# Patient Record
Sex: Male | Born: 1965 | Race: Black or African American | Hispanic: No | Marital: Single | Smoking: Never smoker
Health system: Southern US, Community
[De-identification: ages and names within clinical notes are randomized; demographics above are authoritative.]

## PROBLEM LIST (undated history)

## (undated) DIAGNOSIS — G459 Transient cerebral ischemic attack, unspecified: Secondary | ICD-10-CM

## (undated) DIAGNOSIS — E119 Type 2 diabetes mellitus without complications: Secondary | ICD-10-CM

## (undated) DIAGNOSIS — R42 Dizziness and giddiness: Secondary | ICD-10-CM

## (undated) DIAGNOSIS — T7840XA Allergy, unspecified, initial encounter: Secondary | ICD-10-CM

## (undated) DIAGNOSIS — J45909 Unspecified asthma, uncomplicated: Secondary | ICD-10-CM

## (undated) DIAGNOSIS — I10 Essential (primary) hypertension: Secondary | ICD-10-CM

## (undated) HISTORY — DX: Unspecified asthma, uncomplicated: J45.909

## (undated) HISTORY — DX: Dizziness and giddiness: R42

## (undated) HISTORY — PX: HAND TENDON SURGERY: SHX663

## (undated) HISTORY — DX: Allergy, unspecified, initial encounter: T78.40XA

## (undated) HISTORY — PX: KNEE SURGERY: SHX244

## (undated) HISTORY — PX: APPENDECTOMY: SHX54

## (undated) HISTORY — PX: HERNIA REPAIR: SHX51

---

## 1998-10-19 ENCOUNTER — Emergency Department (HOSPITAL_COMMUNITY): Admission: EM | Admit: 1998-10-19 | Discharge: 1998-10-19 | Payer: Self-pay | Admitting: Emergency Medicine

## 1999-08-09 ENCOUNTER — Emergency Department (HOSPITAL_COMMUNITY): Admission: EM | Admit: 1999-08-09 | Discharge: 1999-08-09 | Payer: Self-pay | Admitting: Emergency Medicine

## 1999-10-25 ENCOUNTER — Encounter: Payer: Self-pay | Admitting: Emergency Medicine

## 1999-10-25 ENCOUNTER — Emergency Department (HOSPITAL_COMMUNITY): Admission: EM | Admit: 1999-10-25 | Discharge: 1999-10-25 | Payer: Self-pay | Admitting: Emergency Medicine

## 2001-04-06 ENCOUNTER — Ambulatory Visit: Admission: RE | Admit: 2001-04-06 | Discharge: 2001-04-06 | Payer: Self-pay | Admitting: Pulmonary Disease

## 2001-05-27 ENCOUNTER — Ambulatory Visit: Admission: RE | Admit: 2001-05-27 | Discharge: 2001-05-27 | Payer: Self-pay | Admitting: Pulmonary Disease

## 2001-12-14 ENCOUNTER — Emergency Department (HOSPITAL_COMMUNITY): Admission: EM | Admit: 2001-12-14 | Discharge: 2001-12-14 | Payer: Self-pay | Admitting: *Deleted

## 2002-08-23 ENCOUNTER — Ambulatory Visit (HOSPITAL_BASED_OUTPATIENT_CLINIC_OR_DEPARTMENT_OTHER): Admission: RE | Admit: 2002-08-23 | Discharge: 2002-08-24 | Payer: Self-pay | Admitting: Orthopedic Surgery

## 2003-01-26 ENCOUNTER — Emergency Department (HOSPITAL_COMMUNITY): Admission: EM | Admit: 2003-01-26 | Discharge: 2003-01-26 | Payer: Self-pay | Admitting: Emergency Medicine

## 2003-04-18 ENCOUNTER — Encounter: Payer: Self-pay | Admitting: Emergency Medicine

## 2003-04-18 ENCOUNTER — Emergency Department (HOSPITAL_COMMUNITY): Admission: EM | Admit: 2003-04-18 | Discharge: 2003-04-18 | Payer: Self-pay | Admitting: Emergency Medicine

## 2004-03-27 ENCOUNTER — Emergency Department (HOSPITAL_COMMUNITY): Admission: EM | Admit: 2004-03-27 | Discharge: 2004-03-27 | Payer: Self-pay | Admitting: Emergency Medicine

## 2005-02-16 ENCOUNTER — Emergency Department (HOSPITAL_COMMUNITY): Admission: EM | Admit: 2005-02-16 | Discharge: 2005-02-16 | Payer: Self-pay | Admitting: Family Medicine

## 2005-10-01 ENCOUNTER — Ambulatory Visit: Payer: Self-pay | Admitting: Pulmonary Disease

## 2007-05-21 ENCOUNTER — Emergency Department (HOSPITAL_COMMUNITY): Admission: EM | Admit: 2007-05-21 | Discharge: 2007-05-21 | Payer: Self-pay | Admitting: Emergency Medicine

## 2007-09-01 ENCOUNTER — Emergency Department (HOSPITAL_COMMUNITY): Admission: EM | Admit: 2007-09-01 | Discharge: 2007-09-01 | Payer: Self-pay | Admitting: Family Medicine

## 2007-11-21 ENCOUNTER — Emergency Department (HOSPITAL_COMMUNITY): Admission: EM | Admit: 2007-11-21 | Discharge: 2007-11-21 | Payer: Self-pay | Admitting: Emergency Medicine

## 2008-01-13 ENCOUNTER — Emergency Department (HOSPITAL_COMMUNITY): Admission: EM | Admit: 2008-01-13 | Discharge: 2008-01-13 | Payer: Self-pay | Admitting: Emergency Medicine

## 2008-06-15 ENCOUNTER — Emergency Department (HOSPITAL_COMMUNITY): Admission: EM | Admit: 2008-06-15 | Discharge: 2008-06-15 | Payer: Self-pay | Admitting: Family Medicine

## 2008-11-18 ENCOUNTER — Emergency Department (HOSPITAL_COMMUNITY): Admission: EM | Admit: 2008-11-18 | Discharge: 2008-11-18 | Payer: Self-pay | Admitting: Emergency Medicine

## 2010-02-09 ENCOUNTER — Emergency Department (HOSPITAL_COMMUNITY): Admission: EM | Admit: 2010-02-09 | Discharge: 2010-02-09 | Payer: Self-pay | Admitting: Family Medicine

## 2010-02-12 ENCOUNTER — Emergency Department (HOSPITAL_COMMUNITY): Admission: EM | Admit: 2010-02-12 | Discharge: 2010-02-12 | Payer: Self-pay | Admitting: Emergency Medicine

## 2010-02-13 ENCOUNTER — Observation Stay (HOSPITAL_COMMUNITY): Admission: EM | Admit: 2010-02-13 | Discharge: 2010-02-14 | Payer: Self-pay | Admitting: Emergency Medicine

## 2010-02-17 ENCOUNTER — Emergency Department (HOSPITAL_COMMUNITY): Admission: EM | Admit: 2010-02-17 | Discharge: 2010-02-17 | Payer: Self-pay | Admitting: Emergency Medicine

## 2010-02-28 ENCOUNTER — Emergency Department (HOSPITAL_COMMUNITY): Admission: EM | Admit: 2010-02-28 | Discharge: 2010-02-28 | Payer: Self-pay | Admitting: Family Medicine

## 2010-09-26 LAB — DIFFERENTIAL
Basophils Absolute: 0 10*3/uL (ref 0.0–0.1)
Basophils Relative: 0 % (ref 0–1)
Lymphocytes Relative: 20 % (ref 12–46)
Monocytes Absolute: 1 10*3/uL (ref 0.1–1.0)
Monocytes Relative: 15 % — ABNORMAL HIGH (ref 3–12)
Neutro Abs: 4.1 10*3/uL (ref 1.7–7.7)
Neutrophils Relative %: 64 % (ref 43–77)

## 2010-09-26 LAB — POCT I-STAT, CHEM 8
BUN: 14 mg/dL (ref 6–23)
Calcium, Ion: 1.17 mmol/L (ref 1.12–1.32)
Chloride: 106 mEq/L (ref 96–112)
Creatinine, Ser: 1 mg/dL (ref 0.4–1.5)
Glucose, Bld: 89 mg/dL (ref 70–99)
HCT: 43 % (ref 39.0–52.0)
Hemoglobin: 14.6 g/dL (ref 13.0–17.0)
Potassium: 3.8 mEq/L (ref 3.5–5.1)
Sodium: 140 mEq/L (ref 135–145)
TCO2: 24 mmol/L (ref 0–100)

## 2010-09-26 LAB — URINALYSIS, ROUTINE W REFLEX MICROSCOPIC
Bilirubin Urine: NEGATIVE
Hgb urine dipstick: NEGATIVE
Ketones, ur: NEGATIVE mg/dL
Specific Gravity, Urine: 1.018 (ref 1.005–1.030)
pH: 6.5 (ref 5.0–8.0)

## 2010-09-26 LAB — BASIC METABOLIC PANEL
Calcium: 9 mg/dL (ref 8.4–10.5)
GFR calc non Af Amer: >60 mL/min (ref >60)
Glucose, Bld: 89 mg/dL (ref 70–99)
Potassium: 4.1 mEq/L (ref 3.5–5.1)
Sodium: 136 mEq/L (ref 135–145)

## 2010-09-26 LAB — STOOL CULTURE

## 2010-09-26 LAB — CBC
HCT: 42.2 % (ref 39.0–52.0)
Hemoglobin: 14.2 g/dL (ref 13.0–17.0)
MCHC: 33.6 g/dL (ref 30.0–36.0)
RDW: 12.6 % (ref 11.5–15.5)
WBC: 6.4 10*3/uL (ref 4.0–10.5)

## 2010-09-26 LAB — POCT RAPID STREP A (OFFICE): Streptococcus, Group A Screen (Direct): NEGATIVE

## 2010-11-28 NOTE — Op Note (Signed)
NAME:  Reginald Collier, Reginald Collier                            ACCOUNT NO.:  [id number]   MEDICAL RECORD NO.:  [id number]                   PATIENT TYPE:  AMB   LOCATION:  DSC                                  FACILITY:  [facility]   PHYSICIAN:  Feliberto Gottron. Turner Daniels, M.D.                DATE OF BIRTH:  1966-06-17   DATE OF PROCEDURE:  DATE OF DISCHARGE:                                 OPERATIVE REPORT   PREOPERATIVE DIAGNOSES:  1. Right knee medial meniscal tear.  2. Possible acromioclavicular ligament tear.   POSTOPERATIVE DIAGNOSES:  1. Right knee medial meniscal tear, posterior horn complex.  2. Multiple cartilaginous loose bodies from a medial femoral condyle donor     site.  3. Chondromalacia patella.   PROCEDURE:  Right knee arthroscopic partial medial meniscectomy, removal of  loose bodies and debridement of chondromalacia.   SURGEON:  Feliberto Gottron. Turner Daniels, M.D.   FIRST ASSISTANT:  None.   ANESTHESIA:  General LMA.   ESTIMATED BLOOD LOSS:  Minimal.   FLUIDS REPLACED:  800 cc of crystalloid.   DRAINS PLACED:  None.   TOURNIQUET TIME:  None.   INDICATIONS FOR PROCEDURE:  This is a 45 year old gentleman who was injured  at work.  He has had chronic medial joint line pain to his right knee and  may have an ACL tear on clinical examination.  He definitely has a medial  meniscal tear with effusion, pain and catching in his knee, and because of  that is taken for arthroscopic evaluation and treatment with reconstruction  of the ACL if it is torn.   DESCRIPTION OF PROCEDURE:  The patient is identified by arm band, taken to  the operating room at [facility].  Appropriate anesthetic  monitors were attached and general LMA anesthesia induced with the patient  in the supine position.  Lateral post applied to the table and the right  lower extremity prepped and draped in the usual sterile fashion from the  ankle to the mid thigh.  The inferomedial and inferolateral peripatellar  portal  regions were infiltrated with 2-3 cc of 0.5% Marcaine and epinephrine  solution, standard portals were made in the inferomedial, inferolateral and  peripatellar position, followed by introduction of the arthroscope in the  inferolateral portal and the outflow through the inferomedial portal.  We  identified grade 3 chondromalacia of the apex of the patella which was  reamed back stable margins with a 3.5 sucker-shaver.  Moving into the medial  compartment, we encountered two cartilaginous loose bodies, they were  basically delaminated cartilage about 5 mm x 3 mm on one, and 7 mm x 3 mm on  another.  The donor site was identified on the medial femoral condyle with  flap tears and this was debrided back to stable margin as well.  There is  still maybe 1 mm of cartilage remaining in the base  of the crater from the  debridement.  Complex degenerative tear of the posterior horn of the medial  meniscus was identified and debrided back to stable margin with a 3.5 Gator  sucker-shaver, alternating with a straight biter.  The ACL and PCL were  noted to be intact in the lateral compartment, the meniscus and lateral  femoral condyle were in good condition.  There was softening of the  articular cartilage at the lateral tibial condyle with grade 2  chondromalacia which was also lightly debrided and probed.  The gutters were  cleared of any loose bodies.  The scope was taken medial to the TCL to  examine the posterior compartment where no further loose bodies were  encountered.  The knee was washed out with normal saline solution.  The  arthroscopic instruments were removed, dressed with Xeroform, 4 x 4  dressing, sponges, Webril and Ace wrap applied.  Patient was awakened and  taken to the recover room without difficulty.                                               Feliberto Gottron. Turner Daniels, M.D.    Ovid Curd  D:  08/23/2002  T:  08/23/2002  Job:  [id number]

## 2012-10-12 ENCOUNTER — Ambulatory Visit: Payer: Self-pay | Admitting: Family Medicine

## 2012-10-12 VITALS — BP 130/85 | HR 70 | Temp 99.4°F | Resp 17 | Ht 72.5 in | Wt 266.0 lb

## 2012-10-12 DIAGNOSIS — Z0289 Encounter for other administrative examinations: Secondary | ICD-10-CM

## 2012-10-12 DIAGNOSIS — J309 Allergic rhinitis, unspecified: Secondary | ICD-10-CM | POA: Insufficient documentation

## 2012-10-12 DIAGNOSIS — Z0000 Encounter for general adult medical examination without abnormal findings: Secondary | ICD-10-CM
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
## 2012-10-12 NOTE — Patient Instructions (Signed)
You had a small amount of blood and protein in your urine today.  Please follow-up this issues with the doctor of your choice.   Otherwise you meet qualifications for a 2 year DOT card today

## 2013-02-14 ENCOUNTER — Encounter: Payer: Self-pay | Admitting: Family Medicine

## 2014-05-27 ENCOUNTER — Ambulatory Visit (INDEPENDENT_AMBULATORY_CARE_PROVIDER_SITE_OTHER): Payer: 59 | Admitting: Internal Medicine

## 2014-05-27 VITALS — BP 155/106 | HR 86 | Temp 97.9°F | Resp 20 | Ht 71.5 in | Wt 280.1 lb

## 2014-05-27 DIAGNOSIS — Z6838 Body mass index (BMI) 38.0-38.9, adult: Secondary | ICD-10-CM | POA: Insufficient documentation

## 2014-05-27 DIAGNOSIS — G4733 Obstructive sleep apnea (adult) (pediatric): Secondary | ICD-10-CM

## 2014-05-27 DIAGNOSIS — M170 Bilateral primary osteoarthritis of knee: Secondary | ICD-10-CM

## 2014-05-27 DIAGNOSIS — M79671 Pain in right foot: Secondary | ICD-10-CM

## 2014-05-27 DIAGNOSIS — G4730 Sleep apnea, unspecified: Secondary | ICD-10-CM

## 2014-05-27 DIAGNOSIS — M172 Bilateral post-traumatic osteoarthritis of knee: Secondary | ICD-10-CM

## 2014-05-27 DIAGNOSIS — R112 Nausea with vomiting, unspecified: Secondary | ICD-10-CM

## 2014-05-27 DIAGNOSIS — R030 Elevated blood-pressure reading, without diagnosis of hypertension: Secondary | ICD-10-CM

## 2014-05-27 DIAGNOSIS — R358 Other polyuria: Secondary | ICD-10-CM

## 2014-05-27 DIAGNOSIS — J45901 Unspecified asthma with (acute) exacerbation: Secondary | ICD-10-CM

## 2014-05-27 DIAGNOSIS — R5383 Other fatigue: Secondary | ICD-10-CM

## 2014-05-27 DIAGNOSIS — R3589 Other polyuria: Secondary | ICD-10-CM

## 2014-05-27 DIAGNOSIS — J069 Acute upper respiratory infection, unspecified: Secondary | ICD-10-CM

## 2014-05-27 DIAGNOSIS — H538 Other visual disturbances: Secondary | ICD-10-CM

## 2014-05-27 LAB — POCT UA - MICROSCOPIC ONLY
BACTERIA, U MICROSCOPIC: NEGATIVE
CASTS, UR, LPF, POC: NEGATIVE
Crystals, Ur, HPF, POC: NEGATIVE
Epithelial cells, urine per micros: NEGATIVE
Mucus, UA: NEGATIVE
RBC, URINE, MICROSCOPIC: NEGATIVE
WBC, Ur, HPF, POC: NEGATIVE
Yeast, UA: NEGATIVE

## 2014-05-27 LAB — GLUCOSE, POCT (MANUAL RESULT ENTRY): POC GLUCOSE: 81 mg/dL (ref 70–99)

## 2014-05-27 LAB — POCT CBC
GRANULOCYTE PERCENT: 58.4 % (ref 37–80)
HEMATOCRIT: 45.7 % (ref 43.5–53.7)
Hemoglobin: 14.3 g/dL (ref 14.1–18.1)
Lymph, poc: 2.4 (ref 0.6–3.4)
MCH, POC: 29.2 pg (ref 27–31.2)
MCHC: 31.4 g/dL — AB (ref 31.8–35.4)
MCV: 93.1 fL (ref 80–97)
MID (cbc): 0.2 (ref 0–0.9)
MPV: 6.7 fL (ref 0–99.8)
POC Granulocyte: 3.7 (ref 2–6.9)
POC LYMPH PERCENT: 37.9 %L (ref 10–50)
POC MID %: 3.7 %M (ref 0–12)
Platelet Count, POC: 311 10*3/uL (ref 142–424)
RBC: 4.9 M/uL (ref 4.69–6.13)
RDW, POC: 14.5 %
WBC: 6.4 10*3/uL (ref 4.6–10.2)

## 2014-05-27 LAB — POCT GLYCOSYLATED HEMOGLOBIN (HGB A1C): HEMOGLOBIN A1C: 5.6

## 2014-05-27 LAB — POCT URINALYSIS DIPSTICK
BILIRUBIN UA: NEGATIVE
GLUCOSE UA: NEGATIVE
Ketones, UA: NEGATIVE
LEUKOCYTES UA: NEGATIVE
NITRITE UA: NEGATIVE
PH UA: 6
Protein, UA: NEGATIVE
Spec Grav, UA: 1.02
UROBILINOGEN UA: 0.2

## 2014-05-27 MED ORDER — ONDANSETRON HCL 4 MG PO TABS: 4.0000 mg | ORAL_TABLET | Freq: Three times a day (TID) | ORAL | Status: DC | PRN

## 2014-05-27 NOTE — Progress Notes (Signed)
Subjective:    Patient ID: Reginald Collier, male    DOB: 1966-01-26, 48 y.o.   MRN: [id number] This chart was scribed for Tami Lin, MD by Marti Sleigh, Medical Scribe. This patient was seen in Room 2 and the patient's care was started a 3:59 PM.   HPI HPI Comments: Reginald Collier is a 48 y.o. male with a past hx of asthma and sleep apnea who presents to [facility] complaining of fatigue,  and urgent urination that has been ongoing for the last 2 months. Pt endorses increased nocturnal urination and polydipsia. Pt denies dysuria, hesitation, change in stream, dribbling. No penile discharge. No history of prostate problems. For the past several days he has had episodes of blurred vision although this has not interfered with driving. No headaches or dizziness. No syncope. No chest pain. No palpitations. No unsteady gait. No peripheral sensory losses.  Pt states that he has a family hx of DM (grandmother, mother, and cousin).   Pt also endorses SOB that has been ongoing for the last several months.evaluation at the [facility] has included a stress test which is normal. He has a history of exercise-induced bronchospasm and he still gets symptomatic from time to time. [facility] which [facility] been [facility].  Pt also complains of nausea with associated vomiting, fever, chills, and body aches that started 3d ago. Also nasal congestion with slight sore throat that seems to be improving.   Pt also states that his right foot has been hurting for the last six months intermittently with standing or walking. Pt states he has bad knees, and is scheduled for bilateral knee replacement at the [place]. This is been delayed due to their scheduling problems.  Patient Active Problem List   Diagnosis Date Noted  . BMI 38.0-38.9,adult 05/27/2014  . OSA (obstructive sleep apnea) 05/27/2014  . Osteoarthritis of both knees 05/27/2014  . Allergic rhinitis 10/12/2012  there is no history of hypertension  although he is being evaluated for this and monitored on 2 prior occasions where it was finally determined that he did not have hypertension that required medical therapy.  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  HENT: Positive for congestion and rhinorrhea.   Eyes: Positive for visual disturbance.  Respiratory: Positive for cough and shortness of breath.   Gastrointestinal: Positive for nausea and vomiting.  Genitourinary: Positive for urgency and frequency. Negative for dysuria and difficulty urinating.  Musculoskeletal: Positive for gait problem ( secondary to right foot pain and bilateral knee pain).       Objective:   Physical Exam  Constitutional: He is oriented to person, place, and time. He appears well-developed and well-nourished. No distress.  HENT:  Head: Normocephalic and atraumatic.  Right Ear: External ear normal.  Left Ear: External ear normal.  Nose: Nose normal.  Mouth/Throat: Oropharynx is clear and moist.  Very shallow hypopharynx.   Eyes: Conjunctivae and EOM are normal. Pupils are equal, round, and reactive to light.  Neck: Neck supple. No thyromegaly present.  Cardiovascular: Normal rate, regular rhythm, normal heart sounds and intact distal pulses.   No murmur heard. Pulmonary/Chest: Effort normal and breath sounds normal. No respiratory distress. He has no wheezes.  Musculoskeletal: He exhibits no edema.  TTP on the plantar aspect of right foot near mid sole. Adequate dorsiflexion of the  foot. Fallen arches bilaterally.   Lymphadenopathy:    He has no cervical adenopathy.  Neurological: He is alert and oriented to person, place, and time. He has normal reflexes. No cranial nerve deficit. He exhibits normal muscle tone. Coordination normal.  Skin: Skin is warm and dry.  Psychiatric: He has a normal mood  and affect. His behavior is normal. Thought content normal.  Nursing note and vitals reviewed.  Results for orders placed or performed in visit on 05/27/14  POCT CBC  Result Value Ref Range   WBC 6.4 4.6 - 10.2 K/uL   Lymph, poc 2.4 0.6 - 3.4   POC LYMPH PERCENT 37.9 10 - 50 %L   MID (cbc) 0.2 0 - 0.9   POC MID % 3.7 0 - 12 %M   POC Granulocyte 3.7 2 - 6.9   Granulocyte percent 58.4 37 - 80 %G   RBC 4.90 4.69 - 6.13 M/uL   Hemoglobin 14.3 14.1 - 18.1 g/dL   HCT, POC 45.7 43.5 - 53.7 %   MCV 93.1 80 - 97 fL   MCH, POC 29.2 27 - 31.2 pg   MCHC 31.4 (A) 31.8 - 35.4 g/dL   RDW, POC 14.5 %   Platelet Count, POC 311 142 - 424 K/uL   MPV 6.7 0 - 99.8 fL  POCT glucose (manual entry)  Result Value Ref Range   POC Glucose 81 70 - 99 mg/dl  POCT glycosylated hemoglobin (Hb A1C)  Result Value Ref Range   Hemoglobin A1C 5.6   POCT urinalysis dipstick  Result Value Ref Range   Color, UA yellow    Clarity, UA clear    Glucose, UA neg    Bilirubin, UA neg    Ketones, UA neg    Spec Grav, UA 1.020    Blood, UA trace-lysed    pH, UA 6.0    Protein, UA neg    Urobilinogen, UA 0.2    Nitrite, UA neg    Leukocytes, UA Negative   POCT UA - Microscopic Only  Result Value Ref Range   WBC, Ur, HPF, POC neg    RBC, urine, microscopic neg    Bacteria, U Microscopic neg    Mucus, UA neg    Epithelial cells, urine per micros neg    Crystals, Ur, HPF, POC neg    Casts, Ur, LPF, POC neg    Yeast, UA neg        Assessment & Plan:   Blurred vision -unclear etiology/recommend ophthalmology evaluation  Will take out of work to restrict driving activity for the next 4 days until we had a chance to evaluate this Polyuria - Other fatigue - Sleep apnea-on CPAP  Osteoarthritis of both knees, unspecified osteoarthritis type  Reactive airway disease, unspecified asthma severity, with acute exacerbation -   URI (upper respiratory infection) - resolving  Right foot pain--early plantar  fasciitis secondary to change in gait from the pain//follow with orthopedics at the [facility]  Nausea and vomiting, vomiting of unspecified type - ?related to acute infection//light diet plus Zofran and fluids and follow for  changes in symptoms  Elevated blood pressure reading without diagnosis of hypertension---home blood pressure twice a day and report results in 4 days  BMI 38.0-38.9,adult  Post-[facility] of both [facility]  Meds ordered this encounter  Medications  . ondansetron (ZOFRAN) 4 MG tablet    Sig: Take 1 tablet (4 mg total) by mouth every 8 (eight) hours as needed for nausea or  vomiting.    Dispense:  20 tablet    Refill:  0   Notify lab results   I have completed the patient encounter in its entirety as documented by the scribe, with editing by me where necessary. Robert P. Laney Pastor, M.D.

## 2014-05-28 LAB — COMPREHENSIVE METABOLIC PANEL
ALBUMIN: 4.4 g/dL (ref 3.5–5.2)
ALK PHOS: 68 U/L (ref 39–117)
ALT: 27 U/L (ref 0–53)
AST: 18 U/L (ref 0–37)
BUN: 12 mg/dL (ref 6–23)
CALCIUM: 9.4 mg/dL (ref 8.4–10.5)
CHLORIDE: 102 meq/L (ref 96–112)
CO2: 25 mEq/L (ref 19–32)
Creat: 0.91 mg/dL (ref 0.50–1.35)
Glucose, Bld: 97 mg/dL (ref 70–99)
POTASSIUM: 4.1 meq/L (ref 3.5–5.3)
SODIUM: 138 meq/L (ref 135–145)
TOTAL PROTEIN: 7.5 g/dL (ref 6.0–8.3)
Total Bilirubin: 0.3 mg/dL (ref 0.2–1.2)

## 2014-05-29 ENCOUNTER — Telehealth: Payer: Self-pay

## 2014-05-29 NOTE — Telephone Encounter (Signed)
Dr. Laney Pastor, pt wanted to let you know that his BP continued to be elevated so he went to the [place] today and they gave him Amlodipine 10mg . I added this to his med list.   Pt also inquiring about labs, please advise.

## 2014-05-31 ENCOUNTER — Encounter: Payer: Self-pay | Admitting: Internal Medicine

## 2014-06-17 ENCOUNTER — Other Ambulatory Visit: Payer: Self-pay

## 2014-06-17 ENCOUNTER — Encounter (HOSPITAL_COMMUNITY): Payer: Self-pay

## 2014-06-17 ENCOUNTER — Emergency Department (HOSPITAL_COMMUNITY)
Admission: EM | Admit: 2014-06-17 | Discharge: 2014-06-17 | Disposition: A | Discharge status: Home / Self Care | Payer: 59 | Attending: Emergency Medicine | Admitting: Emergency Medicine

## 2014-06-17 DIAGNOSIS — I10 Essential (primary) hypertension: Secondary | ICD-10-CM | POA: Insufficient documentation

## 2014-06-17 DIAGNOSIS — R42 Dizziness and giddiness: Secondary | ICD-10-CM | POA: Diagnosis not present

## 2014-06-17 DIAGNOSIS — Z79899 Other long term (current) drug therapy: Secondary | ICD-10-CM | POA: Insufficient documentation

## 2014-06-17 DIAGNOSIS — J45909 Unspecified asthma, uncomplicated: Secondary | ICD-10-CM | POA: Insufficient documentation

## 2014-06-17 LAB — CBC WITH DIFFERENTIAL/PLATELET
BASOS ABS: 0 10*3/uL (ref 0.0–0.1)
BASOS PCT: 0 % (ref 0–1)
EOS ABS: 0.1 10*3/uL (ref 0.0–0.7)
Eosinophils Relative: 1 % (ref 0–5)
HCT: 43.1 % (ref 39.0–52.0)
HEMOGLOBIN: 14.3 g/dL (ref 13.0–17.0)
Lymphocytes Relative: 42 % (ref 12–46)
Lymphs Abs: 2.5 10*3/uL (ref 0.7–4.0)
MCH: 30.5 pg (ref 26.0–34.0)
MCHC: 33.2 g/dL (ref 30.0–36.0)
MCV: 91.9 fL (ref 78.0–100.0)
MONOS PCT: 8 % (ref 3–12)
Monocytes Absolute: 0.5 10*3/uL (ref 0.1–1.0)
NEUTROS ABS: 2.8 10*3/uL (ref 1.7–7.7)
NEUTROS PCT: 49 % (ref 43–77)
PLATELETS: 288 10*3/uL (ref 150–400)
RBC: 4.69 MIL/uL (ref 4.22–5.81)
RDW: 12.6 % (ref 11.5–15.5)
WBC: 5.9 10*3/uL (ref 4.0–10.5)

## 2014-06-17 LAB — COMPREHENSIVE METABOLIC PANEL
ALBUMIN: 4.1 g/dL (ref 3.5–5.2)
ALK PHOS: 73 U/L (ref 39–117)
ALT: 48 U/L (ref 0–53)
ANION GAP: 13 (ref 5–15)
AST: 27 U/L (ref 0–37)
BILIRUBIN TOTAL: 0.3 mg/dL (ref 0.3–1.2)
BUN: 21 mg/dL (ref 6–23)
CHLORIDE: 99 meq/L (ref 96–112)
CO2: 25 mEq/L (ref 19–32)
Calcium: 9.8 mg/dL (ref 8.4–10.5)
Creatinine, Ser: 1.02 mg/dL (ref 0.50–1.35)
GFR calc Af Amer: >90 mL/min (ref >90)
GFR calc non Af Amer: 86 mL/min — ABNORMAL LOW (ref >90)
Glucose, Bld: 101 mg/dL — ABNORMAL HIGH (ref 70–99)
POTASSIUM: 4 meq/L (ref 3.7–5.3)
SODIUM: 137 meq/L (ref 137–147)
TOTAL PROTEIN: 8 g/dL (ref 6.0–8.3)

## 2014-06-17 LAB — I-STAT TROPONIN, ED: Troponin i, poc: 0 ng/mL (ref 0.00–0.08)

## 2014-06-17 MED ORDER — HYDROCHLOROTHIAZIDE 12.5 MG PO TABS: 12.5000 mg | ORAL_TABLET | Freq: Every morning | ORAL | Status: DC

## 2014-06-17 MED ORDER — SODIUM CHLORIDE 0.9 % IV BOLUS (SEPSIS)
1000.0000 mL | Freq: Once | INTRAVENOUS | Status: AC
Start: 2014-06-17 — End: 2014-06-17
  Administered 2014-06-17: 1000 mL via INTRAVENOUS

## 2014-06-17 NOTE — ED Provider Notes (Signed)
CSN: [id number]     Arrival date & time 06/17/14  1652 History   First MD Initiated Contact with Patient 06/17/14 1712     Chief Complaint  Patient presents with  . Dizziness     (Consider location/radiation/quality/duration/timing/severity/associated sxs/prior Treatment) The history is provided by the patient.  DEZMAN GRANDA is a 48 y.o. male hx of asthma here with dizziness. Patient was recently diagnosed with hypertension. He was started on Norvasc 5 mg daily 2 weeks ago. About a week ago he was started on HCTZ 25 mg daily. For the last week he has been feeling lightheaded and dizzy. Denies actual syncope. Denies chest pain or shortness of breath.   Past Medical History  Diagnosis Date  . Allergy   . Asthma    Past Surgical History  Procedure Laterality Date  . Appendectomy    . Hernia repair    . Hand tendon surgery    . Knee surgery      scope on both knees   Family History  Problem Relation Age of Onset  . Diabetes Mother   . Hypertension Mother   . Cancer Mother   . Alcohol abuse Brother   . Cancer Maternal Grandmother   . Cancer Maternal Grandfather   . Mental illness Sister    History  Substance Use Topics  . Smoking status: Never Smoker   . Smokeless tobacco: Never Used  . Alcohol Use: No    Review of Systems  Neurological: Positive for dizziness.  All other systems reviewed and are negative.     Allergies  Review of patient's allergies indicates no known allergies.  Home Medications   Prior to Admission medications   Medication Sig Start Date End Date Taking? Authorizing Provider  albuterol (PROVENTIL HFA;VENTOLIN HFA) 108 (90 BASE) MCG/ACT inhaler Inhale 1-2 puffs into the lungs every 6 (six) hours as needed for wheezing or shortness of breath.   Yes Historical Provider, MD  amLODipine (NORVASC) 10 MG tablet Take 5 mg by mouth every morning.    Yes Historical Provider, MD  hydrochlorothiazide (HYDRODIURIL) 25 MG tablet Take 25 mg by mouth every  morning.   Yes Historical Provider, MD   BP 136/94 mmHg  Pulse 89  Temp(Src) 97.8 F (36.6 C) (Oral)  Resp 17  SpO2 100% Physical Exam  Constitutional: He is oriented to person, place, and time. He appears well-developed and well-nourished.  HENT:  Head: Normocephalic.  Mouth/Throat: Oropharynx is clear and moist.  Eyes: Conjunctivae and EOM are normal. Pupils are equal, round, and reactive to light.  No nystagmus   Neck: Normal range of motion. Neck supple.  Cardiovascular: Normal rate, regular rhythm and normal heart sounds.   Pulmonary/Chest: Effort normal and breath sounds normal. No respiratory distress. He has no wheezes. He has no rales.  Abdominal: Soft. Bowel sounds are normal. He exhibits no distension. There is no tenderness. There is no rebound.  Musculoskeletal: Normal range of motion. He exhibits no edema or tenderness.  Neurological: He is alert and oriented to person, place, and time. No cranial nerve deficit. Coordination normal.  Skin: Skin is warm and dry.  Psychiatric: He has a normal mood and affect. His behavior is normal. Judgment and thought content normal.  Nursing note and vitals reviewed.   ED Course  Procedures (including critical care time) Labs Review Labs Reviewed  COMPREHENSIVE METABOLIC PANEL - Abnormal; Notable for the following:    Glucose, Bld 101 (*)    GFR calc non Af Wyvonnia Lora  86 (*)    All other components within normal limits  CBC WITH DIFFERENTIAL  I-STAT TROPOININ, ED    Imaging Review No results found.   EKG Interpretation None        Date: 06/17/2014  Rate: 80  Rhythm: normal sinus rhythm  QRS Axis: normal  Intervals: normal  ST/T Wave abnormalities: nonspecific ST changes  Conduction Disutrbances:none  Narrative Interpretation:   Old EKG Reviewed: none available    MDM   Final diagnoses:  None     ALCIDES NUTTING is a 48 y.o. male here with lightheadedness. Likely from BP dropping too low with 2 BP meds. Will check  labs, orthostatics. Will hydrate.   6:22 PM Labs unremarkable. Not orthostatic. Felt better with IVF. Will dec HCTZ to 12.5 mg daily and have him f/u with PMD. Likely symptomatic from BP getting too low.      Wandra Arthurs, MD 06/17/14 [phone]

## 2014-06-17 NOTE — ED Notes (Signed)
MD at bedside. 

## 2014-06-17 NOTE — Discharge Instructions (Signed)
Decrease HCTZ to 12.5 mg daily.   Continue Norvasc.   Follow up with your doctor in a week to recheck BP.   Return to ER if you have dizziness, passing out.

## 2014-06-17 NOTE — ED Notes (Signed)
Pt reports he has been feeling dizzy since around 11/21. Pt was recently placed on blood pressure medication approx 3 weeks ago. Pt denies any syncopal episodes but does report some dizziness.

## 2014-12-31 ENCOUNTER — Encounter (HOSPITAL_COMMUNITY): Payer: Self-pay

## 2014-12-31 ENCOUNTER — Emergency Department (HOSPITAL_COMMUNITY)
Admission: EM | Admit: 2014-12-31 | Discharge: 2015-01-01 | Disposition: A | Discharge status: Home / Self Care | Payer: Managed Care, Other (non HMO) | Attending: Emergency Medicine | Admitting: Emergency Medicine

## 2014-12-31 DIAGNOSIS — Z79899 Other long term (current) drug therapy: Secondary | ICD-10-CM | POA: Diagnosis not present

## 2014-12-31 DIAGNOSIS — J45909 Unspecified asthma, uncomplicated: Secondary | ICD-10-CM | POA: Diagnosis not present

## 2014-12-31 DIAGNOSIS — Z8673 Personal history of transient ischemic attack (TIA), and cerebral infarction without residual deficits: Secondary | ICD-10-CM | POA: Insufficient documentation

## 2014-12-31 DIAGNOSIS — I10 Essential (primary) hypertension: Secondary | ICD-10-CM | POA: Diagnosis present

## 2014-12-31 DIAGNOSIS — Z791 Long term (current) use of non-steroidal anti-inflammatories (NSAID): Secondary | ICD-10-CM | POA: Diagnosis not present

## 2014-12-31 HISTORY — DX: Transient cerebral ischemic attack, unspecified: G45.9

## 2014-12-31 NOTE — ED Notes (Signed)
Pt complains of high blood pressure, he went to the ER in [place] on Thursday and they treated him for a TIA, he came home and was told to come here if any problems, his primary doctor is the [place] in [place].

## 2015-01-01 MED ORDER — AMLODIPINE BESYLATE 5 MG PO TABS
5.0000 mg | ORAL_TABLET | Freq: Once | ORAL | Status: AC
  Administered 2015-01-01: 5 mg via ORAL
  Filled 2015-01-01: qty 1

## 2015-01-01 NOTE — ED Provider Notes (Signed)
CSN: [id number]     Arrival date & time 12/31/14  2128 History   First MD Initiated Contact with Patient 12/31/14 2227     Chief Complaint  Patient presents with  . Hypertension     (Consider location/radiation/quality/duration/timing/severity/associated sxs/prior Treatment) HPI   Reginald Collier is a 49 y/o male w/ a h/o TIA 4 days ago, who presents to the ED today with a concern for HTN.  This evening he was feeling very tired,  he went to a pharmacy to have his BP taken and reports that the first reading was had a systolic pressure in the 563'O and the re-check was 154/102.  [facility] and receives care and his systolic BP was in the 756'E, they told him to go to the ED to have his BP lowered.    He denies having any HA, chest pain, or SOB, he reports feeling swimmy headed yesterday.  He is currently not taking any BP medicines, he reports formerly being on HCTZ for HTN but that it made his BP too low so it was discontinued. He has Norvasc but he feels this is for a heart murmur and hasn't been taking it. He also takes another BP medication but cannot remember its name, he is not taking this either because he is unsure of what its for.   He was discharged from a hospital in [place] where he was seen for a TIA on Friday, and his chart is being sent to the [place].  He reports that his symptoms for his TIA were sudden numbness of his left arm leg and tongue, with speech difficulty.  He reports that his systolic BP in the hospital was between the 160's-170's.    Past Medical History  Diagnosis Date  . Allergy   . Asthma   . TIA (transient ischemic attack)    Past Surgical History  Procedure Laterality Date  . Appendectomy    . Hernia repair    . Hand tendon surgery    . Knee surgery      scope on both knees   Family History  Problem Relation Age of Onset  . Diabetes Mother   . Hypertension Mother   . Cancer Mother   . Alcohol abuse  Brother   . Cancer Maternal Grandmother   . Cancer Maternal Grandfather   . Mental illness Sister    History  Substance Use Topics  . Smoking status: Never Smoker   . Smokeless tobacco: Never Used  . Alcohol Use: No    Review of Systems  10 Systems reviewed and are negative for acute change except as noted in the HPI.   Allergies  Review of patient's allergies indicates no known allergies.  Home Medications   Prior to Admission medications   Medication Sig Start Date End Date Taking? Authorizing Provider  albuterol (PROVENTIL HFA;VENTOLIN HFA) 108 (90 BASE) MCG/ACT inhaler Inhale 1-2 puffs into the lungs every 6 (six) hours as needed for wheezing or shortness of breath.   Yes Historical Provider, MD  amLODipine (NORVASC) 10 MG tablet Take 5 mg by mouth every morning.    Yes Historical Provider, MD  naproxen (NAPROSYN) 500 MG tablet Take 500 mg by mouth 2 (two) times daily with a meal.   Yes Historical Provider, MD  hydrochlorothiazide (HYDRODIURIL) 12.5 MG tablet Take 1 tablet (12.5 mg total) by mouth every morning. Patient not taking: Reported on 12/31/2014 06/17/14   Fenton Malling  Darl Householder, MD   BP 154/95 mmHg  Pulse 97  Temp(Src) 97.8 F (36.6 C) (Oral)  Resp 20  Ht 6\' 2"  (1.88 m)  Wt 270 lb (122.471 kg)  BMI 34.65 kg/m2  SpO2 98% Physical Exam  Constitutional: He is oriented to person, place, and time. He appears well-developed and well-nourished. No distress.  HENT:  Head: Normocephalic and atraumatic.  Eyes: EOM are normal. Pupils are equal, round, and reactive to light.  Neck: Normal range of motion. Neck supple.  Cardiovascular: Normal rate and regular rhythm.   Pulmonary/Chest: Effort normal.  Abdominal: Soft.  Neurological: He is alert and oriented to person, place, and time.  Cranial nerves II-VIII and X-XII evaluated and show no deficits. Pt alert and oriented x 3 Upper and lower extremity strength is symmetrical and physiologic Normal muscular tone No facial  droop Coordination intact No pronator drift  Skin: Skin is warm and dry.  Psychiatric: His mood appears not anxious. He does not exhibit a depressed mood.  Nursing note and vitals reviewed.   ED Course  Procedures (including critical care time) Labs Review Labs Reviewed - No data to display  Imaging Review No results found.   EKG Interpretation   Date/Time:  Monday December 31 2014 22:00:00 EDT Ventricular Rate:  66 PR Interval:  158 QRS Duration: 91 QT Interval:  385 QTC Calculation: 403 R Axis:   -42 Text Interpretation:  Sinus rhythm Left axis deviation Since last tracing  R axis shift, although tracing 9 06/17/14)  is similar to 02/16/2005  Confirmed by WENTZ  MD, ELLIOTT [phone]) on 12/31/2014 10:23:21 PM      MDM   Final diagnoses:  None    Presentation is non concerning for [facility], ICH, Meningitis, or temporal arteritis. Pt is afebrile with no focal neuro deficits, nuchal rigidity, or change in vision. The patient denies any symptoms of neurological impairment or TIA's; no amaurosis, diplopia, dysphasia, or unilateral disturbance of motor or sensory function. No loss of balance or vertigo.  Patient is without headache and completely asymptomatic. He has been instructed to take his Norvasc to help stabilize his blood pressure and speak with his PCP tomorrow to discuss medications and confusion regarding them.  Medications - No data to display  49 y.o.Wes Lezotte Fees's evaluation in the Emergency Department is complete. It has been determined that no acute conditions requiring further emergency intervention are present at this time. The patient/guardian have been advised of the diagnosis and plan. We have discussed signs and symptoms that warrant return to the ED, such as changes or worsening in symptoms.  Vital signs are stable at discharge. Filed Vitals:   12/31/14 2144  BP: 154/95  Pulse: 97  Temp: 97.8 F (36.6 C)  Resp: 20    Patient/guardian has voiced  understanding and agreed to follow-up with the PCP or specialist.     Delos Haring, PA-C 01/01/15 0026  Shanon Rosser, MD 01/01/15 4827

## 2015-01-01 NOTE — Discharge Instructions (Signed)

## 2015-06-08 ENCOUNTER — Encounter (HOSPITAL_COMMUNITY): Payer: Self-pay | Admitting: Emergency Medicine

## 2015-06-08 ENCOUNTER — Emergency Department (HOSPITAL_COMMUNITY)
Admission: EM | Admit: 2015-06-08 | Discharge: 2015-06-09 | Disposition: A | Discharge status: Home / Self Care | Payer: Non-veteran care | Attending: Emergency Medicine | Admitting: Emergency Medicine

## 2015-06-08 DIAGNOSIS — Z79899 Other long term (current) drug therapy: Secondary | ICD-10-CM | POA: Diagnosis not present

## 2015-06-08 DIAGNOSIS — J45909 Unspecified asthma, uncomplicated: Secondary | ICD-10-CM | POA: Insufficient documentation

## 2015-06-08 DIAGNOSIS — R42 Dizziness and giddiness: Secondary | ICD-10-CM | POA: Diagnosis present

## 2015-06-08 DIAGNOSIS — I951 Orthostatic hypotension: Secondary | ICD-10-CM | POA: Insufficient documentation

## 2015-06-08 DIAGNOSIS — Z8673 Personal history of transient ischemic attack (TIA), and cerebral infarction without residual deficits: Secondary | ICD-10-CM | POA: Diagnosis not present

## 2015-06-08 LAB — CBC
HCT: 41.8 % (ref 39.0–52.0)
Hemoglobin: 13.9 g/dL (ref 13.0–17.0)
MCH: 30.3 pg (ref 26.0–34.0)
MCHC: 33.3 g/dL (ref 30.0–36.0)
MCV: 91.1 fL (ref 78.0–100.0)
PLATELETS: 305 10*3/uL (ref 150–400)
RBC: 4.59 MIL/uL (ref 4.22–5.81)
RDW: 12.6 % (ref 11.5–15.5)
WBC: 6 10*3/uL (ref 4.0–10.5)

## 2015-06-08 LAB — BASIC METABOLIC PANEL
Anion gap: 8 (ref 5–15)
BUN: 17 mg/dL (ref 6–20)
CALCIUM: 9.5 mg/dL (ref 8.9–10.3)
CO2: 26 mmol/L (ref 22–32)
CREATININE: 1.11 mg/dL (ref 0.61–1.24)
Chloride: 102 mmol/L (ref 101–111)
Glucose, Bld: 132 mg/dL — ABNORMAL HIGH (ref 65–99)
Potassium: 3.7 mmol/L (ref 3.5–5.1)
SODIUM: 136 mmol/L (ref 135–145)

## 2015-06-08 LAB — CBG MONITORING, ED: GLUCOSE-CAPILLARY: 136 mg/dL — AB (ref 65–99)

## 2015-06-08 LAB — I-STAT TROPONIN, ED: TROPONIN I, POC: 0.01 ng/mL (ref 0.00–0.08)

## 2015-06-08 NOTE — ED Notes (Signed)
Pt c/o dizziness, near syncope and nausea onset today at home. Pt neuro intact, denies CP. Pt had recent TIA, became concerned this may be presenting again.

## 2015-06-08 NOTE — ED Provider Notes (Signed)
CSN: [id number]     Arrival date & time 06/08/15  1945 History  By signing my name below, I, Irene Pap, attest that this documentation has been prepared under the direction and in the presence of Varney Biles, MD. Electronically Signed: Irene Pap, ED Scribe. 06/08/2015. 2:18 AM.    Chief Complaint  Patient presents with  . Dizziness   The history is provided by the patient. No language interpreter was used.  HPI Comments: Reginald Collier is a 49 y.o. Male with a hx of TIA and HTN who presents to the Emergency Department complaining of gradually worsening, intermittent room spinning sensation dizziness onset earlier today. Pt says that he woke up with a dizzy feeling and drank a lot of water, "it felt like I was dehydrated." Pt reports associated nausea and being near syncope. He states that he took a nap later and woke up, his symptoms had decreased, but they resumed soon after. Pt called the nurse at the [place] in [place] for his symptoms and was told to come to the ED. He reports having a TIA in June and is concerned that he may be experiencing another one with his current symptoms. He states that when he had the TIA, his symptoms included numbness in the extremities, nausea, and "my head was feeling weird."  He reports that he had a full workup with "90" tests and everything was negative. He reports worsening nausea and dizziness with changing positions. Pt was recently prescribed Amlodipine and Chlorthalidone for HTN which he started 2 weeks ago. He states that he is also having "sleepiness" following an EEG in September. Pt denies trying anything for his symptoms. He denies visual disturbances, chest pain, diarrhea, vomiting, numbness, tingling, weakness, or LOC. He denies hx of DM, smoking, alcohol abuse, or substance abuse. Pt is due to see [facility].   ROS 10 Systems reviewed and are negative for acute change except as noted in the HPI.    Past Medical History  Diagnosis  Date  . Allergy   . Asthma   . TIA (transient ischemic attack)    Past Surgical History  Procedure Laterality Date  . Appendectomy    . Hernia repair    . Hand tendon surgery    . Knee surgery      scope on both knees   Family History  Problem Relation Age of Onset  . Diabetes Mother   . Hypertension Mother   . Cancer Mother   . Alcohol abuse Brother   . Cancer Maternal Grandmother   . Cancer Maternal Grandfather   . Mental illness Sister    Social History  Substance Use Topics  . Smoking status: Never Smoker   . Smokeless tobacco: Never Used  . Alcohol Use: No    Review of Systems  Eyes: Negative for visual disturbance.  Cardiovascular: Negative for chest pain.  Gastrointestinal: Positive for nausea. Negative for vomiting and diarrhea.  Neurological: Positive for dizziness. Negative for syncope, weakness and numbness.   Allergies  Review of patient's allergies indicates no known allergies.  Home Medications   Prior to Admission medications   Medication Sig Start Date End Date Taking? Authorizing Provider  albuterol (PROVENTIL HFA;VENTOLIN HFA) 108 (90 BASE) MCG/ACT inhaler Inhale 1-2 puffs into the lungs every 6 (six) hours as needed for wheezing or shortness of breath.   Yes Historical Provider, MD  amLODipine (NORVASC) 5 MG tablet Take 5 mg by mouth daily.   Yes Historical Provider, MD  chlorthalidone (HYGROTON)  25 MG tablet Take 25 mg by mouth daily.   Yes Historical Provider, MD  naproxen (NAPROSYN) 500 MG tablet Take 500 mg by mouth 2 (two) times daily as needed (for knee pain.).    Yes Historical Provider, MD   BP 146/105 mmHg  Pulse 67  Temp(Src) 97.7 F (36.5 C) (Oral)  Resp 19  SpO2 99% Physical Exam  Constitutional: He appears well-developed and well-nourished. No distress.  HENT:  Head: Normocephalic and atraumatic.  Mouth/Throat: Oropharynx is clear and moist. No oropharyngeal exudate.  Eyes: Conjunctivae and EOM are normal. Pupils are equal,  round, and reactive to light. Right eye exhibits no discharge. Left eye exhibits no discharge. No scleral icterus.  Neck: Normal range of motion. Neck supple. No JVD present. No thyromegaly present.  Cardiovascular: Normal rate, regular rhythm, normal heart sounds and intact distal pulses.  Exam reveals no gallop and no friction rub.   No murmur heard. Pulmonary/Chest: Effort normal and breath sounds normal. No respiratory distress. He has no wheezes. He has no rales.  Abdominal: Soft. Bowel sounds are normal. He exhibits no distension and no mass. There is no tenderness.  Musculoskeletal: Normal range of motion. He exhibits no edema or tenderness.  Lymphadenopathy:    He has no cervical adenopathy.  Neurological: He is alert. No cranial nerve deficit. Coordination normal.  Cranial nerves 2-12 intact; Cerebellar exam review shows no dysmetria; Upper and lower extremity strength 4/5; sensation intact bilaterally for all extremities; negative Hints exam  Skin: Skin is warm and dry. No rash noted. No erythema.  Psychiatric: He has a normal mood and affect. His behavior is normal.  Nursing note and vitals reviewed.   ED Course  Procedures (including critical care time) DIAGNOSTIC STUDIES: Oxygen Saturation is 100% on RA, normal by my interpretation.    COORDINATION OF CARE: 12:26 AM-Discussed treatment plan which includes labs, EKG, IV fluids,  and CT scan with pt at bedside and pt agreed to plan.   @3 : 30 am: Pt has ambulated w/o any difficulty. Repeat eval reveals no new vertigo, dizziness. Pt's neuro exam is still nonfocal. Low risk for strokes. We have advised aspirin. Pt has a neurology f/u anyways this week, which is reassuring. With + orthostatics and new meds, he will see his pcp to see if any adjustments are needed.   Labs Review Labs Reviewed  BASIC METABOLIC PANEL - Abnormal; Notable for the following:    Glucose, Bld 132 (*)    All other components within normal limits   URINALYSIS, ROUTINE W REFLEX MICROSCOPIC (NOT AT [facility]) - Abnormal; Notable for the following:    Hgb urine dipstick TRACE (*)    All other components within normal limits  CBG MONITORING, ED - Abnormal; Notable for the following:    Glucose-Capillary 136 (*)    All other components within normal limits  CBC  URINE RAPID DRUG SCREEN, HOSP PERFORMED  URINE MICROSCOPIC-ADD ON  I-STAT TROPOININ, ED    Imaging Review Ct Head Wo Contrast  06/09/2015  CLINICAL DATA:  Initial evaluation for dizziness, frontal pressure. EXAM: CT HEAD WITHOUT CONTRAST TECHNIQUE: Contiguous axial images were obtained from the base of the skull through the vertex without intravenous contrast. COMPARISON:  None available. FINDINGS: There is no acute intracranial hemorrhage or infarct. No mass lesion or midline shift. Gray-Figueira matter differentiation is well maintained. Ventricles are normal in size without evidence of hydrocephalus. CSF containing spaces are within normal limits. No extra-axial fluid collection. The calvarium is intact. Orbital  soft tissues are within normal limits. Mild scattered mucosal thickening within the ethmoidal air cells. Paranasal sinuses are otherwise clear. No mastoid effusion. Scalp soft tissues are unremarkable. IMPRESSION: Negative head CT with no acute intracranial process. Electronically Signed   By: Jeannine Boga M.D.   On: 06/09/2015 02:07   I have personally reviewed and evaluated these images and lab results as part of my medical decision-making.   EKG Interpretation   Date/Time:  Saturday June 08 2015 20:34:12 EST Ventricular Rate:  90 PR Interval:  160 QRS Duration: 98 QT Interval:  447 QTC Calculation: 547 R Axis:   -40 Text Interpretation:  Sinus rhythm Ventricular premature complex Aberrant  conduction of SV complex(es) Left axis deviation Low voltage, precordial  leads Borderline T abnormalities, inferior leads Prolonged QT interval  Confirmed by BELFI  MD,  MELANIE ([id number]) on 06/08/2015 11:11:43 PM      MDM   Final diagnoses:  Orthostatic hypotension   I personally performed the services described in this documentation, which was scribed in my presence. The recorded information has been reviewed and is accurate.  Pt comes in w/ cc of dizziness. Pt's symptoms are not typical - he reports dizziness with vertigo, intermittent when he woke up. He has had constant underlying "not feeling well in the head" sensation - which is totally non specific. Hx of TIA - with a very low ABCD2 score - with a full neuro workup in June at the [place], and with current neuro f/u in the coming week. He also has new anti BP meds, and he is orthostatic +.  Ct head ordered. Neuro exam is non focal. We will r/o bleeds or mimics of TIA, stroke. If the workup is neg, pt will be reassessed to see if MRI is needed.  Varney Biles, MD 06/09/15 [phone]

## 2015-06-08 NOTE — ED Notes (Signed)
Pt states that he feels nauseous and dizzy when changing positions but denies numbness, tingling, LOC, vomiting, and sensory changes.  He is alert and oriented x 4 and has equal bilateral strength in all extremities.  No acute distress.

## 2015-06-09 ENCOUNTER — Emergency Department (HOSPITAL_COMMUNITY): Payer: Non-veteran care

## 2015-06-09 ENCOUNTER — Encounter (HOSPITAL_COMMUNITY): Payer: Self-pay | Admitting: Radiology

## 2015-06-09 LAB — URINE MICROSCOPIC-ADD ON
Bacteria, UA: NONE SEEN
SQUAMOUS EPITHELIAL / LPF: NONE SEEN
WBC UA: NONE SEEN WBC/hpf (ref 0–5)

## 2015-06-09 LAB — URINALYSIS, ROUTINE W REFLEX MICROSCOPIC
BILIRUBIN URINE: NEGATIVE
GLUCOSE, UA: NEGATIVE mg/dL
Ketones, ur: NEGATIVE mg/dL
Leukocytes, UA: NEGATIVE
Nitrite: NEGATIVE
PROTEIN: NEGATIVE mg/dL
SPECIFIC GRAVITY, URINE: 1.027 (ref 1.005–1.030)
pH: 6.5 (ref 5.0–8.0)

## 2015-06-09 LAB — RAPID URINE DRUG SCREEN, HOSP PERFORMED
AMPHETAMINES: NOT DETECTED
Barbiturates: NOT DETECTED
Benzodiazepines: NOT DETECTED
Cocaine: NOT DETECTED
OPIATES: NOT DETECTED
TETRAHYDROCANNABINOL: NOT DETECTED

## 2015-06-09 NOTE — ED Notes (Signed)
Ambulated pt unassisted around perimeter of ED with no complications.  Pt states he noticed slightly blurry vision but no dizziness or nausea while ambulating.

## 2015-06-09 NOTE — Discharge Instructions (Signed)
We saw you in the ER for THE DIZZINESS AND NAUSEA. All the results in the ER are normal, labs and imaging. We are not sure what is causing your symptoms. We think the symptoms are from ORTHOSTATIC HYPOTENSION - possibly due to the new meds. Hydrate well, call the prescriber to see if the doses need to be changed.  The workup in the ER is not complete, and is limited to screening for life threatening and emergent conditions only, so please see the Neurologist as you have already planned to do next week.  Please return to the ER if you have worsening of your symptoms, constant dizziness/spinning, new vision changes, numbness, tingling.   Orthostatic Hypotension Orthostatic hypotension is a sudden drop in blood pressure. It happens when you quickly stand up from a seated or lying position. You may feel dizzy or light-headed. This can last for just a few seconds or for up to a few minutes. It is usually not a serious problem. However, if this happens frequently or gets worse, it can be a sign of something more serious. CAUSES  Different things can cause orthostatic hypotension, including:   Loss of body fluids (dehydration).  Medicines that lower blood pressure.  Sudden changes in posture, such as standing up quickly after you have been sitting or lying down.  Taking too much of your medicine. SIGNS AND SYMPTOMS   Light-headedness or dizziness.   Fainting or near-fainting.   A fast heart rate.   Weakness.   Feeling tired (fatigue).  DIAGNOSIS  Your health care provider may do several things to help diagnose your condition and identify the cause. These may include:   Taking a medical history and doing a physical exam.  Checking your blood pressure. Your health care provider will check your blood pressure when you are:  Lying down.  Sitting.  Standing.  Using tilt table testing. In this test, you lie down on a table that moves from a lying position to a standing position.  You will be strapped onto the table. This test monitors your blood pressure and heart rate when you are in different positions. TREATMENT  Treatment will vary depending on the cause. Possible treatments include:   Changing the dosage of your medicines.  Wearing compression stockings on your lower legs.  Standing up slowly after sitting or lying down.  Eating more salt.  Eating frequent, small meals.  In some cases, getting IV fluids.  Taking medicine to enhance fluid retention. HOME CARE INSTRUCTIONS  Only take over-the-counter or prescription medicines as directed by your health care provider.  Follow your health care provider's instructions for changing the dosage of your current medicines.  Do not stop or adjust your medicine on your own.  Stand up slowly after sitting or lying down. This allows your body to adjust to the different position.  Wear compression stockings as directed.  Eat extra salt as directed.  Do not add extra salt to your diet unless directed to by your health care provider.  Eat frequent, small meals.  Avoid standing suddenly after eating.  Avoid hot showers or excessive heat as directed by your health care provider.  Keep all follow-up appointments. SEEK MEDICAL CARE IF:  You continue to feel dizzy or light-headed after standing.  You feel groggy or confused.  You feel cold, clammy, or sick to your stomach (nauseous).  You have blurred vision.  You feel short of breath. SEEK IMMEDIATE MEDICAL CARE IF:   You faint after standing.  You have chest pain.  You have difficulty breathing.   You lose feeling or movement in your arms or legs.   You have slurred speech or difficulty talking, or you are unable to talk.  MAKE SURE YOU:   Understand these instructions.  Will watch your condition.  Will get help right away if you are not doing well or get worse.   This information is not intended to replace advice given to you by  your health care provider. Make sure you discuss any questions you have with your health care provider.   Document Released: 06/19/2002 Document Revised: 07/04/2013 Document Reviewed: 04/21/2013 Elsevier Interactive Patient Education 2016 [employer]. Vertigo Vertigo means you feel like you or your surroundings are moving when they are not. Vertigo can be dangerous if it occurs when you are at work, driving, or performing difficult activities.  CAUSES  Vertigo occurs when there is a conflict of signals sent to your brain from the visual and sensory systems in your body. There are many different causes of vertigo, including:  Infections, especially in the inner ear.  A bad reaction to a drug or misuse of alcohol and medicines.  Withdrawal from drugs or alcohol.  Rapidly changing positions, such as lying down or rolling over in bed.  A migraine headache.  Decreased blood flow to the brain.  Increased pressure in the brain from a head injury, infection, tumor, or bleeding. SYMPTOMS  You may feel as though the world is spinning around or you are falling to the ground. Because your balance is upset, vertigo can cause nausea and vomiting. You may have involuntary eye movements (nystagmus). DIAGNOSIS  Vertigo is usually diagnosed by physical exam. If the cause of your vertigo is unknown, your caregiver may perform imaging tests, such as an MRI scan (magnetic resonance imaging). TREATMENT  Most cases of vertigo resolve on their own, without treatment. Depending on the cause, your caregiver may prescribe certain medicines. If your vertigo is related to body position issues, your caregiver may recommend movements or procedures to correct the problem. In rare cases, if your vertigo is caused by certain inner ear problems, you may need surgery. HOME CARE INSTRUCTIONS   Follow your caregiver's instructions.  Avoid driving.  Avoid operating heavy machinery.  Avoid performing any tasks that  would be dangerous to you or others during a vertigo episode.  Tell your caregiver if you notice that certain medicines seem to be causing your vertigo. Some of the medicines used to treat vertigo episodes can actually make them worse in some people. SEEK IMMEDIATE MEDICAL CARE IF:   Your medicines do not relieve your vertigo or are making it worse.  You develop problems with talking, walking, weakness, or using your arms, hands, or legs.  You develop severe headaches.  Your nausea or vomiting continues or gets worse.  You develop visual changes.  A family member notices behavioral changes.  Your condition gets worse. MAKE SURE YOU:  Understand these instructions.  Will watch your condition.  Will get help right away if you are not doing well or get worse.   This information is not intended to replace advice given to you by your health care provider. Make sure you discuss any questions you have with your health care provider.   Document Released: 04/08/2005 Document Revised: 09/21/2011 Document Reviewed: 10/22/2014 Elsevier Interactive Patient Education 2016 [employer] Inc[employer] Dizziness Dizziness is a common problem. It is a feeling of unsteadiness or light-headedness. You may feel like you  are about to faint. Dizziness can lead to injury if you stumble or fall. Anyone can become dizzy, but dizziness is more common in older adults. This condition can be caused by a number of things, including medicines, dehydration, or illness. HOME CARE INSTRUCTIONS Taking these steps may help with your condition: Eating and Drinking  Drink enough fluid to keep your urine clear or pale yellow. This helps to keep you from becoming dehydrated. Try to drink more clear fluids, such as water.  Do not drink alcohol.  Limit your caffeine intake if directed by your health care provider.  Limit your salt intake if directed by your health care provider. Activity  Avoid making quick movements.  Rise  slowly from chairs and steady yourself until you feel okay.  In the morning, first sit up on the side of the bed. When you feel okay, stand slowly while you hold onto something until you know that your balance is fine.  Move your legs often if you need to stand in one place for a long time. Tighten and relax your muscles in your legs while you are standing.  Do not drive or operate heavy machinery if you feel dizzy.  Avoid bending down if you feel dizzy. Place items in your home so that they are easy for you to reach without leaning over. Lifestyle  Do not use any tobacco products, including cigarettes, chewing tobacco, or electronic cigarettes. If you need help quitting, ask your health care provider.  Try to reduce your stress level, such as with yoga or meditation. Talk with your health care provider if you need help. General Instructions  Watch your dizziness for any changes.  Take medicines only as directed by your health care provider. Talk with your health care provider if you think that your dizziness is caused by a medicine that you are taking.  Tell a friend or a family member that you are feeling dizzy. If he or she notices any changes in your behavior, have this person call your health care provider.  Keep all follow-up visits as directed by your health care provider. This is important. SEEK MEDICAL CARE IF:  Your dizziness does not go away.  Your dizziness or light-headedness gets worse.  You feel nauseous.  You have reduced hearing.  You have new symptoms.  You are unsteady on your feet or you feel like the room is spinning. SEEK IMMEDIATE MEDICAL CARE IF:  You vomit or have diarrhea and are unable to eat or drink anything.  You have problems talking, walking, swallowing, or using your arms, hands, or legs.  You feel generally weak.  You are not thinking clearly or you have trouble forming sentences. It may take a friend or family member to notice  this.  You have chest pain, abdominal pain, shortness of breath, or sweating.  Your vision changes.  You notice any bleeding.  You have a headache.  You have neck pain or a stiff neck.  You have a fever.   This information is not intended to replace advice given to you by your health care provider. Make sure you discuss any questions you have with your health care provider.   Document Released: 12/23/2000 Document Revised: 11/13/2014 Document Reviewed: 06/25/2014 Elsevier Interactive Patient Education [employer].

## 2015-06-17 ENCOUNTER — Encounter: Payer: Self-pay | Admitting: *Deleted

## 2015-06-17 ENCOUNTER — Encounter: Payer: Self-pay | Admitting: Neurology

## 2015-06-17 ENCOUNTER — Ambulatory Visit (INDEPENDENT_AMBULATORY_CARE_PROVIDER_SITE_OTHER): Payer: Non-veteran care | Admitting: Neurology

## 2015-06-17 VITALS — BP 131/84 | HR 91 | Ht 74.0 in | Wt 283.0 lb

## 2015-06-17 DIAGNOSIS — D352 Benign neoplasm of pituitary gland: Secondary | ICD-10-CM | POA: Insufficient documentation

## 2015-06-17 DIAGNOSIS — G4733 Obstructive sleep apnea (adult) (pediatric): Secondary | ICD-10-CM

## 2015-06-17 DIAGNOSIS — H8110 Benign paroxysmal vertigo, unspecified ear: Secondary | ICD-10-CM | POA: Insufficient documentation

## 2015-06-17 NOTE — Progress Notes (Signed)
PATIENT: Reginald Collier DOB: 1966/07/10  Chief Complaint  Patient presents with  . Dizziness    Orhtostatic Vitals: Lying 132/82, 83, Sitting 120/78, 88, Standing 129/86, 94.  Two days after Thanksgiving, he had an event of intense pressure in his head and dizziness that worsened with positional changes. He went to [facility] for treatment.  He had similar event in the June 2016.  . Transient Ischemic Attack    Reports having a history of a TIA event in June 2016.  Says he is now on aspirin and blood pressure medication.  . Sleep Apnea    Currently on CPAP therapy.     HISTORICAL  [name] is a 49 years old right-handed male, seen in refer by  [place] choice approval for medical care program, primary care physician is [name], MD  I reviewed and summarized office visit, he had a past medical history of hypertension, asthma, works as a Administrator  He woke up June 08 2015 notice dizziness, surroundings were floating around him, unsteady gait, nausea, denied tinnitus, no hearing loss, he actually presented to emergency room, I personally reviewed CAT scan of the brain that was normal, ever since the event, with sudden positional change, such as lying down, bending over, he had transient dizziness, especially when turning to his right side, over the past 2 weeks, his symptoms overall has improved, but continue has dizziness with sudden positional change.  EEG April 30 2015 was reported as normal.  Ultrasound of carotid artery August 2016: no evidence of hemodynamically significant stenosis  I personally reviewed MRI of the brain without contrast no evidence of acute or subacute infarction, prominent pituitary gland 9.8 mm, stable in size date back to 2009  laboratory showed no significant abnormality on CBC, BMP, UA, negative UDS.  He also reported previous history of sudden onset left arm and leg numbness about 6 months ago in June 2016, has been off work since then, he no  longer has left side sensory changes, was diagnosed with TIA.  REVIEW OF SYSTEMS: Full 14 system review of systems performed and notable only for dizziness,  headaches, cough, runny nose, snoring, neck stiffness,  ALLERGIES: No Known Allergies  HOME MEDICATIONS: Current Outpatient Prescriptions  Medication Sig Dispense Refill  . albuterol (PROVENTIL HFA;VENTOLIN HFA) 108 (90 BASE) MCG/ACT inhaler Inhale 1-2 puffs into the lungs every 6 (six) hours as needed for wheezing or shortness of breath.    Marland Kitchen amLODipine (NORVASC) 5 MG tablet Take 5 mg by mouth daily.    Marland Kitchen aspirin 325 MG tablet Take 325 mg by mouth daily.    . chlorthalidone (HYGROTON) 25 MG tablet Take 25 mg by mouth daily.    . naproxen (NAPROSYN) 500 MG tablet Take 500 mg by mouth 2 (two) times daily as needed (for knee pain.).      No current facility-administered medications for this visit.    PAST MEDICAL HISTORY: Past Medical History  Diagnosis Date  . Allergy   . Asthma   . TIA (transient ischemic attack)   . Dizziness     PAST SURGICAL HISTORY: Past Surgical History  Procedure Laterality Date  . Appendectomy    . Hernia repair    . Hand tendon surgery    . Knee surgery      scope on both knees    FAMILY HISTORY: Family History  Problem Relation Age of Onset  . Diabetes Mother   . Hypertension Mother   .  Cancer Mother   . Alcohol abuse Brother   . Cancer Maternal Grandmother   . Cancer Maternal Grandfather   . Mental illness Sister     SOCIAL HISTORY:  Social History   Social History  . Marital Status: Single    Spouse Name: N/A  . Number of Children: 0  . Years of Education: BS   Occupational History  . Transportation/Drives truck    Social History Main Topics  . Smoking status: Never Smoker   . Smokeless tobacco: Never Used  . Alcohol Use: No  . Drug Use: No  . Sexual Activity: Not on file   Other Topics Concern  . Not on file   Social History Narrative   Lives at home alone.    Left-handed.   No caffeine use.     PHYSICAL EXAM   Filed Vitals:   06/17/15 1457  BP: 131/84  Pulse: 91  Height: 6\' 2"  (1.88 m)  Weight: 283 lb (128.368 kg)    Not recorded      Body mass index is 36.32 kg/(m^2).  PHYSICAL EXAMNIATION:  Gen: NAD, conversant, well nourised, obese, well groomed                     Cardiovascular: Regular rate rhythm, no peripheral edema, warm, nontender. Eyes: Conjunctivae clear without exudates or hemorrhage Neck: Supple, no carotid bruise. Pulmonary: Clear to auscultation bilaterally   NEUROLOGICAL EXAM:  MENTAL STATUS: Speech:    Speech is normal; fluent and spontaneous with normal comprehension.  Cognition:     Orientation to time, place and person     Normal recent and remote memory     Normal Attention span and concentration     Normal Language, naming, repeating,spontaneous speech     Fund of knowledge   CRANIAL NERVES: CN II: Visual fields are full to confrontation. Fundoscopic exam is normal with sharp discs and no vascular changes. Pupils are round equal and briskly reactive to light. CN III, IV, VI: extraocular movement are normal. No ptosis. CN V: Facial sensation is intact to pinprick in all 3 divisions bilaterally. Corneal responses are intact.  CN VII: Face is symmetric with normal eye closure and smile. CN VIII: Hearing is normal to rubbing fingers CN IX, X: Palate elevates symmetrically. Phonation is normal. CN XI: Head turning and shoulder shrug are intact CN XII: Tongue is midline with normal movements and no atrophy. Narrow oropharyngeal space  MOTOR: There is no pronator drift of out-stretched arms. Muscle bulk and tone are normal. Muscle strength is normal.  REFLEXES: Reflexes are 2+ and symmetric at the biceps, triceps, knees, and ankles. Plantar responses are flexor.  SENSORY: Intact to light touch, pinprick, position sense, and vibration sense are intact in fingers and toes.  COORDINATION: Rapid  alternating movements and fine finger movements are intact. There is no dysmetria on finger-to-nose and heel-knee-shin.    GAIT/STANCE: Posture is normal. Gait is steady with normal steps, base, arm swing, and turning. Heel and toe walking are normal. Tandem gait is normal.  Romberg is absent.  Apley's maneuver: Right ear dependent position first introduced intense rotatory downward beating nystagmus, habituate quickly,   DIAGNOSTIC DATA (LABS, IMAGING, TESTING) - I reviewed patient records, labs, notes, testing and imaging myself where available.   ASSESSMENT AND PLAN  HAIRO DOOLY is a 49 y.o. male    Benign positional vertigo  Continually repositional maneuver at home right ear dependent position first  I have written notes  for him to go back driving  Reported history of TIA  Continue to address vascular risk factor, keep daily aspirin    Obstructive sleep apnea   Marcial Pacas, M.D. Ph.D.  [facility] [address] [place], [place] [address] Ph: [phone] Fax: [phone]  CC: Windy Fast, MD

## 2015-06-17 NOTE — Patient Instructions (Signed)
Benign Positional Vertigo Vertigo is the feeling that you or your surroundings are moving when they are not. Benign positional vertigo is the most common form of vertigo. The cause of this condition is not serious (is benign). This condition is triggered by certain movements and positions (is positional). This condition can be dangerous if it occurs while you are doing something that could endanger you or others, such as driving.  CAUSES In many cases, the cause of this condition is not known. It may be caused by a disturbance in an area of the inner ear that helps your brain to sense movement and balance. This disturbance can be caused by a viral infection (labyrinthitis), head injury, or repetitive motion. RISK FACTORS This condition is more likely to develop in:  Women.  People who are 50 years of age or older. SYMPTOMS Symptoms of this condition usually happen when you move your head or your eyes in different directions. Symptoms may start suddenly, and they usually last for less than a minute. Symptoms may include:  Loss of balance and falling.  Feeling like you are spinning or moving.  Feeling like your surroundings are spinning or moving.  Nausea and vomiting.  Blurred vision.  Dizziness.  Involuntary eye movement (nystagmus). Symptoms can be mild and cause only slight annoyance, or they can be severe and interfere with daily life. Episodes of benign positional vertigo may return (recur) over time, and they may be triggered by certain movements. Symptoms may improve over time. DIAGNOSIS This condition is usually diagnosed by medical history and a physical exam of the head, neck, and ears. You may be referred to a health care provider who specializes in ear, nose, and throat (ENT) problems (otolaryngologist) or a provider who specializes in disorders of the nervous system (neurologist). You may have additional testing, including:  MRI.  A CT scan.  Eye movement tests. Your  health care provider may ask you to change positions quickly while he or she watches you for symptoms of benign positional vertigo, such as nystagmus. Eye movement may be tested with an electronystagmogram (ENG), caloric stimulation, the Dix-Hallpike test, or the roll test.  An electroencephalogram (EEG). This records electrical activity in your brain.  Hearing tests. TREATMENT Usually, your health care provider will treat this by moving your head in specific positions to adjust your inner ear back to normal. Surgery may be needed in severe cases, but this is rare. In some cases, benign positional vertigo may resolve on its own in 2-4 weeks. HOME CARE INSTRUCTIONS Safety  Move slowly.Avoid sudden body or head movements.  Avoid driving.  Avoid operating heavy machinery.  Avoid doing any tasks that would be dangerous to you or others if a vertigo episode would occur.  If you have trouble walking or keeping your balance, try using a cane for stability. If you feel dizzy or unstable, sit down right away.  Return to your normal activities as told by your health care provider. Ask your health care provider what activities are safe for you. General Instructions  Take over-the-counter and prescription medicines only as told by your health care provider.  Avoid certain positions or movements as told by your health care provider.  Drink enough fluid to keep your urine clear or pale yellow.  Keep all follow-up visits as told by your health care provider. This is important. SEEK MEDICAL CARE IF:  You have a fever.  Your condition gets worse or you develop new symptoms.  Your family or friends   notice any behavioral changes.  Your nausea or vomiting gets worse.  You have numbness or a "pins and needles" sensation. SEEK IMMEDIATE MEDICAL CARE IF:  You have difficulty speaking or moving.  You are always dizzy.  You faint.  You develop severe headaches.  You have weakness in your  legs or arms.  You have changes in your hearing or vision.  You develop a stiff neck.  You develop sensitivity to light.   This information is not intended to replace advice given to you by your health care provider. Make sure you discuss any questions you have with your health care provider.   Document Released: 04/06/2006 Document Revised: 03/20/2015 Document Reviewed: 10/22/2014 Elsevier Interactive Patient Education 2016 [employer]  

## 2015-07-11 ENCOUNTER — Encounter: Payer: Self-pay | Admitting: Neurology

## 2015-07-11 ENCOUNTER — Ambulatory Visit (INDEPENDENT_AMBULATORY_CARE_PROVIDER_SITE_OTHER): Payer: Non-veteran care | Admitting: Neurology

## 2015-07-11 VITALS — BP 138/84 | HR 81 | Resp 20 | Ht 74.0 in | Wt 281.0 lb

## 2015-07-11 DIAGNOSIS — H8110 Benign paroxysmal vertigo, unspecified ear: Secondary | ICD-10-CM

## 2015-07-11 NOTE — Progress Notes (Signed)
Chief Complaint  Patient presents with  . Follow-up    Orthostatics Lying-138/84 81, sitting-128/83 87, standing- 141/98 99  . Dizziness    vertigo better, pt now complaining of "funny feeling in chest", rm 4, alone      PATIENT: Reginald Collier DOB: [birth year]  Chief Complaint  Patient presents with  . Follow-up    Orthostatics Lying-138/84 81, sitting-128/83 87, standing- 141/98 99  . Dizziness    vertigo better, pt now complaining of "funny feeling in chest", rm 4, alone     HISTORICAL  Reginald Collier is a 49 years old right-handed male, seen in refer by  [place] choice approval for medical care program, primary care physician is [name], MD  I reviewed and summarized office visit, he had a past medical history of hypertension, asthma, works as a Administrator  He woke up June 08 2015 notice dizziness, surroundings were floating around him, unsteady gait, nausea, denied tinnitus, no hearing loss, he actually presented to emergency room, I personally reviewed CAT scan of the brain that was normal, ever since the event, with sudden positional change, such as lying down, bending over, he had transient dizziness, especially when turning to his right side, over the past 2 weeks, his symptoms overall has improved, but continue has dizziness with sudden positional change.  EEG April 30 2015 was reported as normal.  Ultrasound of carotid artery August 2016: no evidence of hemodynamically significant stenosis  I personally reviewed MRI of the brain without contrast no evidence of acute or subacute infarction, prominent pituitary gland 9.8 mm, stable in size date back to 2009  laboratory showed no significant abnormality on CBC, BMP, UA, negative UDS.  He also reported previous history of sudden onset left arm and leg numbness about 6 months ago in June 2016, has been off work since then, he no longer has left side sensory changes, was diagnosed with TIA.  UPDATE Jul 11 2015: He has  some fluctuation of his blood pressure, but has overall been very well regulated, his dizziness has much improved with repositioning maneuver   REVIEW OF SYSTEMS: Full 14 system review of systems performed and notable only for dizziness,  headaches, cough, runny nose, snoring, neck stiffness,  ALLERGIES: No Known Allergies  HOME MEDICATIONS: Current Outpatient Prescriptions  Medication Sig Dispense Refill  . albuterol (PROVENTIL HFA;VENTOLIN HFA) 108 (90 BASE) MCG/ACT inhaler Inhale 1-2 puffs into the lungs every 6 (six) hours as needed for wheezing or shortness of breath.    Marland Kitchen amLODipine (NORVASC) 5 MG tablet Take 5 mg by mouth daily.    Marland Kitchen aspirin 325 MG tablet Take 325 mg by mouth daily.    . chlorthalidone (HYGROTON) 25 MG tablet Take 25 mg by mouth daily.    . naproxen (NAPROSYN) 500 MG tablet Take 500 mg by mouth 2 (two) times daily as needed (for knee pain.).      No current facility-administered medications for this visit.    PAST MEDICAL HISTORY: Past Medical History  Diagnosis Date  . Allergy   . Asthma   . TIA (transient ischemic attack)   . Dizziness     PAST SURGICAL HISTORY: Past Surgical History  Procedure Laterality Date  . Appendectomy    . Hernia repair    . Hand tendon surgery    . Knee surgery      scope on both knees    FAMILY HISTORY: Family History  Problem Relation Age of Onset  . Diabetes Mother   .  Hypertension Mother   . Cancer Mother   . Alcohol abuse Brother   . Cancer Maternal Grandmother   . Cancer Maternal Grandfather   . Mental illness Sister     SOCIAL HISTORY:  Social History   Social History  . Marital Status: Single    Spouse Name: N/A  . Number of Children: 0  . Years of Education: BS   Occupational History  . Transportation/Drives truck    Social History Main Topics  . Smoking status: Never Smoker   . Smokeless tobacco: Never Used  . Alcohol Use: No  . Drug Use: No  . Sexual Activity: Not on file   Other  Topics Concern  . Not on file   Social History Narrative   Lives at home alone.   Left-handed.   No caffeine use.     PHYSICAL EXAM   Filed Vitals:   07/11/15 1548  BP: 138/84  Pulse: 81  Resp: 20  Height: 6\' 2"  (1.88 m)  Weight: 281 lb (127.461 kg)    Not recorded      Body mass index is 36.06 kg/(m^2).  PHYSICAL EXAMNIATION:  Gen: NAD, conversant, well nourised, obese, well groomed                     Cardiovascular: Regular rate rhythm, no peripheral edema, warm, nontender. Eyes: Conjunctivae clear without exudates or hemorrhage Neck: Supple, no carotid bruise. Pulmonary: Clear to auscultation bilaterally   NEUROLOGICAL EXAM:  MENTAL STATUS: Speech:    Speech is normal; fluent and spontaneous with normal comprehension.  Cognition:     Orientation to time, place and person     Normal recent and remote memory     Normal Attention span and concentration     Normal Language, naming, repeating,spontaneous speech     Fund of knowledge   CRANIAL NERVES: CN II: Visual fields are full to confrontation. Fundoscopic exam is normal with sharp discs and no vascular changes. Pupils are round equal and briskly reactive to light. CN III, IV, VI: extraocular movement are normal. No ptosis. CN V: Facial sensation is intact to pinprick in all 3 divisions bilaterally. Corneal responses are intact.  CN VII: Face is symmetric with normal eye closure and smile. CN VIII: Hearing is normal to rubbing fingers CN IX, X: Palate elevates symmetrically. Phonation is normal. CN XI: Head turning and shoulder shrug are intact CN XII: Tongue is midline with normal movements and no atrophy. Narrow oropharyngeal space  MOTOR: There is no pronator drift of out-stretched arms. Muscle bulk and tone are normal. Muscle strength is normal.  REFLEXES: Reflexes are 2+ and symmetric at the biceps, triceps, knees, and ankles. Plantar responses are flexor.  SENSORY: Intact to light touch,  pinprick, position sense, and vibration sense are intact in fingers and toes.  COORDINATION: Rapid alternating movements and fine finger movements are intact. There is no dysmetria on finger-to-nose and heel-knee-shin.    GAIT/STANCE: Posture is normal. Gait is steady with normal steps, base, arm swing, and turning. Heel and toe walking are normal. Tandem gait is normal.  Romberg is absent.   DIAGNOSTIC DATA (LABS, IMAGING, TESTING) - I reviewed patient records, labs, notes, testing and imaging myself where available.   ASSESSMENT AND PLAN  VICKY VALLIS is a 49 y.o. male    Benign positional vertigo  Has much improved with repositional maneuver at home right ear dependent position first   Reported history of TIA  Continue to address  vascular risk factor, keep daily aspirin    Obstructive sleep apnea  fFollowed up by [facility] physicians   Marcial Pacas, M.D. Ph.D.  [facility] [address], [place], [place] [address] Ph: [phone] Fax: [phone]  CC: Windy Fast, MD

## 2015-08-12 ENCOUNTER — Emergency Department (HOSPITAL_COMMUNITY)
Admission: EM | Admit: 2015-08-12 | Discharge: 2015-08-12 | Disposition: A | Discharge status: Home / Self Care | Payer: Managed Care, Other (non HMO) | Attending: Emergency Medicine | Admitting: Emergency Medicine

## 2015-08-12 ENCOUNTER — Encounter (HOSPITAL_COMMUNITY): Payer: Self-pay | Admitting: Emergency Medicine

## 2015-08-12 ENCOUNTER — Telehealth: Payer: Self-pay | Admitting: Neurology

## 2015-08-12 DIAGNOSIS — Z8673 Personal history of transient ischemic attack (TIA), and cerebral infarction without residual deficits: Secondary | ICD-10-CM | POA: Insufficient documentation

## 2015-08-12 DIAGNOSIS — G510 Bell's palsy: Secondary | ICD-10-CM | POA: Insufficient documentation

## 2015-08-12 DIAGNOSIS — Z79899 Other long term (current) drug therapy: Secondary | ICD-10-CM | POA: Insufficient documentation

## 2015-08-12 DIAGNOSIS — J45909 Unspecified asthma, uncomplicated: Secondary | ICD-10-CM | POA: Insufficient documentation

## 2015-08-12 DIAGNOSIS — Z7982 Long term (current) use of aspirin: Secondary | ICD-10-CM | POA: Insufficient documentation

## 2015-08-12 LAB — PROTIME-INR
INR: 1.05 (ref 0.00–1.49)
Prothrombin Time: 13.4 seconds (ref 11.6–15.2)

## 2015-08-12 LAB — CBC
HCT: 43.4 % (ref 39.0–52.0)
Hemoglobin: 14.4 g/dL (ref 13.0–17.0)
MCH: 29.9 pg (ref 26.0–34.0)
MCHC: 33.2 g/dL (ref 30.0–36.0)
MCV: 90.2 fL (ref 78.0–100.0)
Platelets: 304 10*3/uL (ref 150–400)
RBC: 4.81 MIL/uL (ref 4.22–5.81)
RDW: 13 % (ref 11.5–15.5)
WBC: 7.1 10*3/uL (ref 4.0–10.5)

## 2015-08-12 LAB — DIFFERENTIAL
Basophils Absolute: 0 10*3/uL (ref 0.0–0.1)
Basophils Relative: 0 %
Eosinophils Absolute: 0.2 10*3/uL (ref 0.0–0.7)
Eosinophils Relative: 3 %
Lymphocytes Relative: 40 %
Lymphs Abs: 2.9 10*3/uL (ref 0.7–4.0)
Monocytes Absolute: 0.6 10*3/uL (ref 0.1–1.0)
Monocytes Relative: 9 %
Neutro Abs: 3.4 10*3/uL (ref 1.7–7.7)
Neutrophils Relative %: 48 %

## 2015-08-12 LAB — I-STAT CHEM 8, ED
BUN: 14 mg/dL (ref 6–20)
Calcium, Ion: 1.09 mmol/L — ABNORMAL LOW (ref 1.12–1.23)
Chloride: 104 mmol/L (ref 101–111)
Creatinine, Ser: 1 mg/dL (ref 0.61–1.24)
Glucose, Bld: 89 mg/dL (ref 65–99)
HCT: 48 % (ref 39.0–52.0)
Hemoglobin: 16.3 g/dL (ref 13.0–17.0)
Potassium: 4 mmol/L (ref 3.5–5.1)
Sodium: 140 mmol/L (ref 135–145)
TCO2: 25 mmol/L (ref 0–100)

## 2015-08-12 LAB — COMPREHENSIVE METABOLIC PANEL
ALT: 29 U/L (ref 17–63)
AST: 21 U/L (ref 15–41)
Albumin: 4.4 g/dL (ref 3.5–5.0)
Alkaline Phosphatase: 66 U/L (ref 38–126)
Anion gap: 9 (ref 5–15)
BUN: 14 mg/dL (ref 6–20)
CO2: 26 mmol/L (ref 22–32)
Calcium: 9.1 mg/dL (ref 8.9–10.3)
Chloride: 103 mmol/L (ref 101–111)
Creatinine, Ser: 0.94 mg/dL (ref 0.61–1.24)
GFR calc Af Amer: >60 mL/min (ref >60)
GFR calc non Af Amer: >60 mL/min (ref >60)
Glucose, Bld: 92 mg/dL (ref 65–99)
Potassium: 4 mmol/L (ref 3.5–5.1)
Sodium: 138 mmol/L (ref 135–145)
Total Bilirubin: 0.9 mg/dL (ref 0.3–1.2)
Total Protein: 8 g/dL (ref 6.5–8.1)

## 2015-08-12 LAB — I-STAT TROPONIN, ED: Troponin i, poc: 0.01 ng/mL (ref 0.00–0.08)

## 2015-08-12 LAB — APTT: aPTT: 26 seconds (ref 24–37)

## 2015-08-12 LAB — CBG MONITORING, ED: Glucose-Capillary: 81 mg/dL (ref 65–99)

## 2015-08-12 MED ORDER — VALACYCLOVIR HCL 1 G PO TABS: 1000.0000 mg | ORAL_TABLET | Freq: Three times a day (TID) | ORAL | Status: DC

## 2015-08-12 MED ORDER — PREDNISONE 20 MG PO TABS: 60.0000 mg | ORAL_TABLET | Freq: Every day | ORAL | Status: DC

## 2015-08-12 NOTE — Telephone Encounter (Signed)
Spoke to patient - he is on his way to the ED for an evaluation.

## 2015-08-12 NOTE — Telephone Encounter (Signed)
Pt called said yesterday 08/11/15 he began having numbness around left side of mouth. He said when he eats he has to chew from the rt side, talking from the rt side . Left eye is watery, no drooping, no vision issues. Pt sts when he smiles there is some drooping of the left side of mouth. Pt was advised 4 times to go to ED. Pt said he is having someone to pick him up to go to ED

## 2015-08-12 NOTE — Discharge Instructions (Signed)
Bell Palsy °Bell palsy is a condition in which the muscles on one side of the face become paralyzed. This often causes one side of the face to droop. It is a common condition and most people recover completely. °RISK FACTORS °Risk factors for Bell palsy include: °· Pregnancy. °· Diabetes. °· An infection by a virus, such as infections that cause cold sores. °CAUSES  °Bell palsy is caused by damage to or inflammation of a nerve in your face. It is unclear why this happens, but an infection by a virus may lead to it. Most of the time the reason it happens is unknown. °SIGNS AND SYMPTOMS  °Symptoms can range from mild to severe and can take place over a number of hours. Symptoms may include: °· Being unable to: °¨ Raise one or both eyebrows. °¨ Close one or both eyes. °¨ Feel parts of your face (facial numbness). °· Drooping of the eyelid and corner of the mouth. °· Weakness in the face. °· Paralysis of half your face. °· Loss of taste. °· Sensitivity to loud noises. °· Difficulty chewing. °· Tearing up of the affected eye. °· Dryness in the affected eye. °· Drooling. °· Pain behind one ear. °DIAGNOSIS  °Diagnosis of Bell palsy may include: °· A medical history and physical exam. °· An MRI. °· A CT scan. °· Electromyography (EMG). This is a test that checks how your nerves are working. °TREATMENT  °Treatment may include antiviral medicine to help shorten the length of the condition. Sometimes treatment is not needed and the symptoms go away on their own. °HOME CARE INSTRUCTIONS  °· Take medicines only as directed by your health care provider. °· Do facial massages and exercises as directed by your health care provider. °· If your eye is affected: °¨ Use moisturizing eye drops to prevent drying of your eye as directed by your health care provider. °¨ Protect your eye as directed by your health care provider. °SEEK MEDICAL CARE IF: °· Your symptoms do not get better or get worse. °· You are drooling. °· Your eye is red,  irritated, or hurts. °SEEK IMMEDIATE MEDICAL CARE IF:  °· Another part of your body feels weak or numb. °· You have difficulty swallowing. °· You have a fever along with symptoms of Bell palsy. °· You develop neck pain. °MAKE SURE YOU:  °· Understand these instructions. °· Will watch your condition. °· Will get help right away if you are not doing well or get worse. °  °This information is not intended to replace advice given to you by your health care provider. Make sure you discuss any questions you have with your health care provider. °  °Document Released: 06/29/2005 Document Revised: 03/20/2015 Document Reviewed: 10/06/2013 °Elsevier Interactive Patient Education ©2016 [employer] ° °

## 2015-08-12 NOTE — ED Provider Notes (Signed)
CSN: [id number]     Arrival date & time 08/12/15  1456 History   First MD Initiated Contact with Patient 08/12/15 1523     Chief Complaint  Patient presents with  . Numbness    L side of face     (Consider location/radiation/quality/duration/timing/severity/associated sxs/prior Treatment) HPI Comments: 50 year old male with history of asthma, TIA who presents with facial weakness. Patient began noticing subtle left facial weakness 2 days ago and the weakness has gradually worsened since it began. He spoke with his physician this morning to be evaluated and was sent here for further evaluation. He states he has very mild numbness of his left corner of lips but denies any other numbness or tingling. He has noticed some difficulty eating and altered taste. No skin changes on his face. No visual problems. He reports a very mild intermittent headache for the past month but denies any sudden onset of headache or severe headache currently. No neck pain. No extremity weakness or problems with balance. He states that his previous TIA affected his arm and leg and he has not noticed any of these symptoms with the facial weakness. No fevers, vomiting, diarrhea, or recent illness. Had vomiting illness several weeks ago.  The history is provided by the patient.    Past Medical History  Diagnosis Date  . Allergy   . Asthma   . TIA (transient ischemic attack)   . Dizziness    Past Surgical History  Procedure Laterality Date  . Appendectomy    . Hernia repair    . Hand tendon surgery    . Knee surgery      scope on both knees   Family History  Problem Relation Age of Onset  . Diabetes Mother   . Hypertension Mother   . Cancer Mother   . Alcohol abuse Brother   . Cancer Maternal Grandmother   . Cancer Maternal Grandfather   . Mental illness Sister    Social History  Substance Use Topics  . Smoking status: Never Smoker   . Smokeless tobacco: Never Used  . Alcohol Use: No    Review of  Systems 10 Systems reviewed and are negative for acute change except as noted in the HPI.    Allergies  Review of patient's allergies indicates no known allergies.  Home Medications   Prior to Admission medications   Medication Sig Start Date End Date Taking? Authorizing Provider  acetaminophen (TYLENOL) 500 MG tablet Take 500-1,000 mg by mouth every 6 (six) hours as needed for moderate pain.   Yes Historical Provider, MD  albuterol (PROVENTIL HFA;VENTOLIN HFA) 108 (90 BASE) MCG/ACT inhaler Inhale 1-2 puffs into the lungs every 6 (six) hours as needed for wheezing or shortness of breath.   Yes Historical Provider, MD  amLODipine (NORVASC) 5 MG tablet Take 5 mg by mouth daily.   Yes Historical Provider, MD  aspirin 325 MG tablet Take 325 mg by mouth daily.   Yes Historical Provider, MD  chlorthalidone (HYGROTON) 25 MG tablet Take 25 mg by mouth daily.   Yes Historical Provider, MD  GuaiFENesin (HERBAL EXPEC PO) Take 1 each by mouth daily. Herbal tea.   Yes Historical Provider, MD  polyvinyl alcohol (LIQUIFILM TEARS) 1.4 % ophthalmic solution Place 1 drop into both eyes daily as needed for dry eyes.   Yes Historical Provider, MD  predniSONE (DELTASONE) 20 MG tablet Take 3 tablets (60 mg total) by mouth daily. For 7 days 08/12/15   Sharlett Iles, MD  valACYclovir (VALTREX) 1000 MG tablet Take 1 tablet (1,000 mg total) by mouth 3 (three) times daily. 08/12/15   Wenda Overland Kwesi Sangha, MD   BP 157/101 mmHg  Pulse 82  Temp(Src) 98.2 F (36.8 C) (Oral)  Resp 16  SpO2 100% Physical Exam  Constitutional: He is oriented to person, place, and time. He appears well-developed and well-nourished. No distress.  Awake, alert  HENT:  Head: Normocephalic and atraumatic.  L facial weakness involving forehead, midface, and mouth  Eyes: Conjunctivae and EOM are normal. Pupils are equal, round, and reactive to light.  Neck: Neck supple.  Cardiovascular: Normal rate, regular rhythm and normal heart  sounds.   No murmur heard. Pulmonary/Chest: Effort normal and breath sounds normal. No respiratory distress.  Abdominal: Soft. Bowel sounds are normal. He exhibits no distension.  Musculoskeletal: He exhibits no edema.  5/5 strength and normal sensation x all 4 ext  Neurological: He is alert and oriented to person, place, and time. He has normal reflexes. He exhibits normal muscle tone.  Fluent speech; L facial droop involving entire left face including forehead w/ L brow drooping; tearing of L eye due to inability to completely close L eye; normal finger-to-nose testing, negative pronator drift  Skin: Skin is warm and dry.  Psychiatric: He has a normal mood and affect. Judgment and thought content normal.  Nursing note and vitals reviewed.   ED Course  Procedures (including critical care time) Labs Review Labs Reviewed  I-STAT CHEM 8, ED - Abnormal; Notable for the following:    Calcium, Ion 1.09 (*)    All other components within normal limits  PROTIME-INR  APTT  CBC  DIFFERENTIAL  COMPREHENSIVE METABOLIC PANEL  I-STAT TROPOININ, ED  CBG MONITORING, ED    Imaging Review No results found. I have personally reviewed and evaluated these lab results as part of my medical decision-making.   EKG Interpretation   Date/Time:  Monday August 12 2015 15:29:37 EST Ventricular Rate:  81 PR Interval:  160 QRS Duration: 95 QT Interval:  393 QTC Calculation: 456 R Axis:   -35 Text Interpretation:  Sinus rhythm Left axis deviation Abnormal R-wave  progression, early transition No significant change since last tracing  Confirmed by Annalis Kaczmarczyk MD, [place] [id number]) on 08/12/2015 3:44:48 PM      MDM   Final diagnoses:  Left-sided Bell's palsy   Patient presents with 2 days of gradually worsening left facial weakness associated with difficulty eating and altered taste sensation. At triage, the patient was noted to have facial droop and was immediately brought back for evaluation. On exam,  he had a left-sided facial droop involving his forehead w/ inability to completely close L eye. The remainder of his neurologic exam was normal and he had no asymmetric weakness of extremities. His exam is consistent with Bell's palsy. No skin changes to suggest zoster infection. Obtained basic lab work listed above which were unremarkable. On reexamination, the patient remains comfortable with no other complaints with the exception of his facial weakness, which is reassuring against TIA or stroke. Gave prescriptions for prednisone and valacyclovir. Counseled on supportive care including use of eye patch and artificial tears to avoid eye injury. Instructed to follow-up with PCP in one week for reevaluation. Return precautions reviewed including the development of any other neurologic symptoms. Patient voiced understanding and was discharged in satisfactory condition.  Sharlett Iles, MD 08/12/15 [phone]

## 2015-08-12 NOTE — ED Notes (Signed)
Patient was alert, oriented and stable upon discharge. RN went over AVS and patient had no further questions.  

## 2015-08-12 NOTE — ED Notes (Signed)
Pt c/o Numbness and tingling to L side of face since yesterday. Pt has hx of TIA.  Pt has L facial droop. Pt unable to raise L eyebrow. Pt denies weakness in L side of body. Symptoms localized in L face. A&Ox4 and ambulatory. Pt c/o slight headache x a "few days."  Pt sts last TIA affected only his L arm. Pt's speech is clear.

## 2015-10-15 ENCOUNTER — Telehealth: Payer: Self-pay | Admitting: Neurology

## 2015-10-15 NOTE — Telephone Encounter (Signed)
Patient is calling and feels he needs to be worked in as he has developed bells palsey and the first appt with Dr.Yan is 11/12/15.

## 2015-10-15 NOTE — Telephone Encounter (Signed)
Left message for a return call

## 2015-10-15 NOTE — Telephone Encounter (Signed)
Per Dr. Krista Blue, let him come in to see NP.  Placed on Megan's schedule - pt aware of time.

## 2015-10-16 ENCOUNTER — Ambulatory Visit: Payer: Self-pay | Admitting: Adult Health

## 2015-10-16 ENCOUNTER — Encounter: Payer: Self-pay | Admitting: Adult Health

## 2015-10-16 ENCOUNTER — Ambulatory Visit (INDEPENDENT_AMBULATORY_CARE_PROVIDER_SITE_OTHER): Payer: Non-veteran care | Admitting: Adult Health

## 2015-10-16 VITALS — BP 142/100 | HR 90 | Resp 18 | Ht 74.0 in | Wt 292.0 lb

## 2015-10-16 DIAGNOSIS — G510 Bell's palsy: Secondary | ICD-10-CM | POA: Diagnosis not present

## 2015-10-16 DIAGNOSIS — Z8673 Personal history of transient ischemic attack (TIA), and cerebral infarction without residual deficits: Secondary | ICD-10-CM

## 2015-10-16 NOTE — Patient Instructions (Signed)
Continue to monitor your symptoms  If your symptoms worsen or you develop new symptoms please let us know.

## 2015-10-16 NOTE — Progress Notes (Signed)
PATIENT: Reginald Collier DOB: 10/08/1965  REASON FOR VISIT: follow up- TIA, benign positional vertigo HISTORY FROM: patient  HISTORY OF PRESENT ILLNESS: Reginald Collier is a 50 year old male with a history of TIA and benign positional vertigo. He returns today to discuss Bell's palsy. The patient states that he developed left facial weakness in January and went to the emergency room. He was treated with steroids. He feels that most of the symptoms have resolved although he does continue to notice some changes with his eating. He tends to shift most of his food to the right side of the mouth. He also notices that maybe his speech is a little different at times. He denies any other neurological symptoms. The patient has a lot of questions about his TIA. 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 He states that he has not been taking aspirin regularly. He is trying to diet and exercise. He has a follow-up appointment with his primary care at the end of the month. He returns today for an evaluation.  HISTORY (YAN): Reginald Collier is a 50 years old right-handed male, seen in refer by [place] choice approval for medical care program, primary care physician is [name], MD  I reviewed and summarized office visit, he had a past medical history of hypertension, asthma, works as a Administrator  He woke up June 08 2015 notice dizziness, surroundings were floating around him, unsteady gait, nausea, denied tinnitus, no hearing loss, he actually presented to emergency room, I personally reviewed CAT scan of the brain that was normal, ever since the event, with sudden positional change, such as lying down, bending over, he had transient dizziness, especially when turning to his right side, over the past 2 weeks, his symptoms overall has improved, but continue has dizziness with sudden positional change.  EEG April 30 2015 was reported as normal.  Ultrasound of carotid  artery August 2016: no evidence of hemodynamically significant stenosis  I personally reviewed MRI of the brain without contrast no evidence of acute or subacute infarction, prominent pituitary gland 9.8 mm, stable in size date back to 2009 laboratory showed no significant abnormality on CBC, BMP, UA, negative UDS.  He also reported previous history of sudden onset left arm and leg numbness about 6 months ago in June 2016, has been off work since then, he no longer has left side sensory changes, was diagnosed with TIA.  UPDATE Jul 11 2015: He has some fluctuation of his blood pressure, but has overall been very well regulated, his dizziness has much improved with repositioning maneuver  REVIEW OF SYSTEMS: Out of a complete 14 system review of symptoms, the patient complains only of the following symptoms, and all other reviewed systems are negative.  Eye redness, shortness breath, daytime sleepiness, weakness  ALLERGIES: No Known Allergies  HOME MEDICATIONS: Outpatient Prescriptions Prior to Visit  Medication Sig Dispense Refill  . acetaminophen (TYLENOL) 500 MG tablet Take 500-1,000 mg by mouth every 6 (six) hours as needed for moderate pain.    Marland Kitchen albuterol (PROVENTIL HFA;VENTOLIN HFA) 108 (90 BASE) MCG/ACT inhaler Inhale 1-2 puffs into the lungs every 6 (six) hours as needed for wheezing or shortness of breath.    Marland Kitchen amLODipine (NORVASC) 5 MG tablet Take 5 mg by mouth daily.    Marland Kitchen aspirin 325 MG tablet Take 325 mg by mouth daily.    . chlorthalidone (HYGROTON) 25 MG tablet Take 25 mg by  mouth daily.    . GuaiFENesin (HERBAL EXPEC PO) Take 1 each by mouth daily. Herbal tea.    . polyvinyl alcohol (LIQUIFILM TEARS) 1.4 % ophthalmic solution Place 1 drop into both eyes daily as needed for dry eyes.    . predniSONE (DELTASONE) 20 MG tablet Take 3 tablets (60 mg total) by mouth daily. For 7 days 21 tablet 0  . valACYclovir (VALTREX) 1000 MG tablet Take 1 tablet (1,000 mg total) by mouth 3  (three) times daily. 21 tablet 0   No facility-administered medications prior to visit.    PAST MEDICAL HISTORY: Past Medical History  Diagnosis Date  . Allergy   . Asthma   . TIA (transient ischemic attack)   . Dizziness     PAST SURGICAL HISTORY: Past Surgical History  Procedure Laterality Date  . Appendectomy    . Hernia repair    . Hand tendon surgery    . Knee surgery      scope on both knees    FAMILY HISTORY: Family History  Problem Relation Age of Onset  . Diabetes Mother   . Hypertension Mother   . Cancer Mother   . Alcohol abuse Brother   . Cancer Maternal Grandmother   . Cancer Maternal Grandfather   . Mental illness Sister     SOCIAL HISTORY: Social History   Social History  . Marital Status: Single    Spouse Name: N/A  . Number of Children: 0  . Years of Education: BS   Occupational History  . Transportation/Drives truck    Social History Main Topics  . Smoking status: Never Smoker   . Smokeless tobacco: Never Used  . Alcohol Use: No  . Drug Use: No  . Sexual Activity: Not on file   Other Topics Concern  . Not on file   Social History Narrative   Lives at home alone.   Left-handed.   No caffeine use.      PHYSICAL EXAM  Filed Vitals:   10/16/15 1534  BP: 142/100  Pulse: 90  Resp: 18  Height: 6\' 2"  (1.88 m)  Weight: 292 lb (132.45 kg)   Body mass index is 37.47 kg/(m^2).  Generalized: Well developed, in no acute distress   Neurological examination  Mentation: Alert oriented to time, place, history taking. Follows all commands speech and language fluent Cranial nerve II-XII: Pupils were equal round reactive to light. Extraocular movements were full, visual field were full on confrontational test. Facial sensation and strength were normal. Uvula tongue midline. Head turning and shoulder shrug  were normal and symmetric.Puff cheek weakness on the left. Motor: The motor testing reveals 5 over 5 strength of all 4 extremities.  Good symmetric motor tone is noted throughout.  Sensory: Sensory testing is intact to soft touch on all 4 extremities. No evidence of extinction is noted.  Coordination: Cerebellar testing reveals good finger-nose-finger and heel-to-shin bilaterally.  Gait and station: Gait is normal. Tandem gait is normal. Romberg is negative. No drift is seen.  Reflexes: Deep tendon reflexes are symmetric and normal bilaterally.   DIAGNOSTIC DATA (LABS, IMAGING, TESTING) - I reviewed patient records, labs, notes, testing and imaging myself where available.  Lab Results  Component Value Date   WBC 7.1 08/12/2015   HGB 16.3 08/12/2015   HCT 48.0 08/12/2015   MCV 90.2 08/12/2015   PLT 304 08/12/2015      Component Value Date/Time   NA 140 08/12/2015 1532   K 4.0 08/12/2015 1532  CL 104 08/12/2015 1532   CO2 26 08/12/2015 1521   GLUCOSE 89 08/12/2015 1532   BUN 14 08/12/2015 1532   CREATININE 1.00 08/12/2015 1532   CREATININE 0.91 05/27/2014 1631   CALCIUM 9.1 08/12/2015 1521   PROT 8.0 08/12/2015 1521   ALBUMIN 4.4 08/12/2015 1521   AST 21 08/12/2015 1521   ALT 29 08/12/2015 1521   ALKPHOS 66 08/12/2015 1521   BILITOT 0.9 08/12/2015 1521   GFRNONAA >60 08/12/2015 1521   GFRAA >60 08/12/2015 1521      ASSESSMENT AND PLAN 50 y.o. year old male  has a past medical history of Allergy; Asthma; TIA (transient ischemic attack); and Dizziness. here with:  1. Bell's palsy 2. History of TIA  I explained to the patient that his symptoms of Bell's palsy can take time to resolve. He may continue to notice some improvement or these residual symptoms can be permanent. Patient voices understanding. Patient is encouraged to continue taking aspirin daily. He should monitor his blood pressure and cholesterol. He should also engage in physical activity daily. Patient advised that if he develops any new symptoms he should let us know. He will follow-up on an as-needed basis.     Ward Givens, MSN,  NP-C 10/16/2015, 4:07 PM [facility] [address] [place], [place] [address] [phone]

## 2015-10-17 NOTE — Progress Notes (Signed)
I have reviewed and agreed above plan. 

## 2016-01-16 ENCOUNTER — Encounter: Payer: Self-pay | Admitting: Neurology

## 2016-01-16 ENCOUNTER — Ambulatory Visit (INDEPENDENT_AMBULATORY_CARE_PROVIDER_SITE_OTHER): Payer: Non-veteran care | Admitting: Neurology

## 2016-01-16 VITALS — BP 141/84 | HR 74 | Ht 74.0 in | Wt 291.8 lb

## 2016-01-16 DIAGNOSIS — D352 Benign neoplasm of pituitary gland: Secondary | ICD-10-CM | POA: Diagnosis not present

## 2016-01-16 DIAGNOSIS — G510 Bell's palsy: Secondary | ICD-10-CM | POA: Insufficient documentation

## 2016-01-16 DIAGNOSIS — G4733 Obstructive sleep apnea (adult) (pediatric): Secondary | ICD-10-CM

## 2016-01-16 NOTE — Progress Notes (Signed)
Chief Complaint  Patient presents with  . Bell's Palsy    Reports continued twitching in his left eye and numbness in his left jaw.  Also, says he has chronic nasal drainage.  . Transient Ischemic Attack    He is taking aspirin 325mg  daily.      PATIENT: Reginald Collier DOB: Feb 13, 1966  HISTORY OF PRESENT ILLNESS: HISTORY: Reginald Collier is a 50 years old right-handed male, seen in refer by [place] choice approval for medical care program, primary care physician is [name], MD in Dec 2016.  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  EEG April 30 2015 was reported as normal.  Ultrasound of carotid artery August 2016: no evidence of hemodynamically significant stenosis  I personally reviewed MRI of the brain without contrast no evidence of acute or subacute infarction, prominent pituitary gland 9.8 mm, stable in size date back to 2009 laboratory showed no significant abnormality on CBC, BMP, UA, negative UDS.  He also reported previous history of sudden onset left arm and leg numbness about 6 months ago in June 2016, has been off work since then, he no longer has left side sensory changes, was diagnosed with TIA.  UPDATE Jul 11 2015: He has some fluctuation of his blood pressure, but has overall been very well regulated, his dizziness has much improved with repositioning maneuver  UPDATE July 6th  2017: Last visit was with nurse practitioner in April 2017, he complains of daytime sleepiness, fatigue, he has gone through [place] counseling program, he can sleep better, 7-8 hours each night, he did had a history of sleep study, obstructive sleep apnea, using CPAP machine, he works as a Administrator  He had left knee arthroscopic surgery on May 4th 2017, ambulate with a cane right now, he has left Bell's palsy in January 2017, he presented to emergency room August 12 2015, was given prescription of prednisone, valcyclovir, he made almost total recovery in 6 weeks, but since June 2017, he noticed intermittent left facial muscle twitching, around his left eye, left cheek,  He reported had MRI brain was normal, this was done at [place] recently. I do not have formal report.  REVIEW OF SYSTEMS: Out of a complete 14 system review of symptoms, the patient complains only of the following symptoms, and all other reviewed systems are negative. Numbness  ALLERGIES: No Known Allergies  HOME MEDICATIONS: Outpatient Prescriptions Prior to Visit  Medication Sig Dispense Refill  . acetaminophen (TYLENOL) 500 MG tablet Take 500-1,000 mg by mouth every 6 (six) hours as needed for moderate pain.    Marland Kitchen albuterol (PROVENTIL HFA;VENTOLIN HFA) 108 (90 BASE) MCG/ACT inhaler Inhale 1-2 puffs into the lungs every 6 (six) hours as needed for wheezing or shortness of breath.    Marland Kitchen amLODipine (NORVASC) 5 MG tablet Take 5 mg by mouth daily.    Marland Kitchen aspirin 325 MG tablet Take 325 mg by mouth daily.    Marland Kitchen  chlorthalidone (HYGROTON) 25 MG tablet Take 25 mg by mouth daily.    . GuaiFENesin (HERBAL EXPEC PO) Take 1 each by mouth daily. Herbal tea.    . polyvinyl alcohol (LIQUIFILM TEARS) 1.4 % ophthalmic solution Place 1 drop into both eyes daily as needed for dry eyes.    . predniSONE (DELTASONE) 20 MG tablet Take 3 tablets (60 mg total) by mouth daily. For 7 days 21 tablet 0  . valACYclovir (VALTREX) 1000 MG tablet Take 1 tablet (1,000  mg total) by mouth 3 (three) times daily. 21 tablet 0   No facility-administered medications prior to visit.    PAST MEDICAL HISTORY: Past Medical History  Diagnosis Date  . Allergy   . Asthma   . TIA (transient ischemic attack)   . Dizziness     PAST SURGICAL HISTORY: Past Surgical History  Procedure Laterality Date  . Appendectomy    . Hernia repair    . Hand tendon surgery    . Knee surgery      scope on both knees    FAMILY HISTORY: Family History  Problem Relation Age of Onset  . Diabetes Mother   . Hypertension Mother   . Cancer Mother   . Alcohol abuse Brother   . Cancer Maternal Grandmother   . Cancer Maternal Grandfather   . Mental illness Sister     SOCIAL HISTORY: Social History   Social History  . Marital Status: Single    Spouse Name: N/A  . Number of Children: 0  . Years of Education: BS   Occupational History  . Transportation/Drives truck    Social History Main Topics  . Smoking status: Never Smoker   . Smokeless tobacco: Never Used  . Alcohol Use: No  . Drug Use: No  . Sexual Activity: Not on file   Other Topics Concern  . Not on file   Social History Narrative   Lives at home alone.   Left-handed.   No caffeine use.      PHYSICAL EXAM  Filed Vitals:   01/16/16 1124  BP: 141/84  Pulse: 74  Height: 6\' 2"  (1.88 m)  Weight: 291 lb 12 oz (132.337 kg)   Body mass index is 37.44 kg/(m^2).  Generalized: Well developed, in no acute distress   Neurological examination  Mentation: Alert oriented to time, place, history taking. Follows all commands speech and language fluent Cranial nerve II-XII: Pupils were equal round reactive to light. Extraocular movements were full, visual field were full on confrontational test. Facial sensation and strength were normal. Uvula tongue midline. Head turning and shoulder shrug  were normal and symmetric.Puff cheek weakness on the left. Motor: The motor testing reveals 5 over 5 strength of all  4 extremities. Good symmetric motor tone is noted throughout.  Sensory: Sensory testing is intact to soft touch on all 4 extremities. No evidence of extinction is noted.  Coordination: Cerebellar testing reveals good finger-nose-finger and heel-to-shin bilaterally.  Gait and station: Using a cane because of left knee arthroscopic surgery.  Reflexes: Deep tendon reflexes are symmetric and normal bilaterally.   DIAGNOSTIC DATA (LABS, IMAGING, TESTING) - I reviewed patient records, labs, notes, testing and imaging myself where available.  Lab Results  Component Value Date   WBC 7.1 08/12/2015   HGB 16.3 08/12/2015   HCT 48.0 08/12/2015   MCV 90.2 08/12/2015   PLT 304 08/12/2015      Component Value Date/Time   NA 140 08/12/2015 1532   K 4.0 08/12/2015  1532   CL 104 08/12/2015 1532   CO2 26 08/12/2015 1521   GLUCOSE 89 08/12/2015 1532   BUN 14 08/12/2015 1532   CREATININE 1.00 08/12/2015 1532   CREATININE 0.91 05/27/2014 1631   CALCIUM 9.1 08/12/2015 1521   PROT 8.0 08/12/2015 1521   ALBUMIN 4.4 08/12/2015 1521   AST 21 08/12/2015 1521   ALT 29 08/12/2015 1521   ALKPHOS 66 08/12/2015 1521   BILITOT 0.9 08/12/2015 1521   GFRNONAA >60 08/12/2015 1521   GFRAA >60 08/12/2015 1521    ASSESSMENT AND PLAN 50 y.o. year old male  with history of obesity, HTN  Left Bell's palsy  Recovered well,  Occasionally left facial muscle spasm History of TIA  Continue to address vascular risk factors, moderate exercise, lose weight, optimize blood pressure control, blood pressure less than 130/80  Aspirin 325 mg daily Obstructive Sleep apnea  Continue follow-up with sleep specialist at [facility]       Marcial Pacas, M.D. Ph.D.  [facility] [place], [place] [address] Phone: [phone] Fax:      [phone]

## 2016-02-26 ENCOUNTER — Telehealth: Payer: Self-pay | Admitting: Neurology

## 2016-02-26 NOTE — Telephone Encounter (Signed)
Pt called in to make appt due to worsening Left sided Bells palsy. Dr. Krista Blue does not have any openings any time soon not sure if pt needed to come in sooner rather than later.  Please call [phone]

## 2016-02-26 NOTE — Telephone Encounter (Signed)
Spoke to Jenny Reichmann - he has been worked into Dr. Rhea Belton schedule tomorrow.

## 2016-02-27 ENCOUNTER — Encounter: Payer: Self-pay | Admitting: Neurology

## 2016-02-27 ENCOUNTER — Ambulatory Visit (INDEPENDENT_AMBULATORY_CARE_PROVIDER_SITE_OTHER): Payer: Non-veteran care | Admitting: Neurology

## 2016-02-27 VITALS — BP 140/92 | HR 83 | Ht 74.0 in | Wt 286.0 lb

## 2016-02-27 DIAGNOSIS — G510 Bell's palsy: Secondary | ICD-10-CM

## 2016-02-27 DIAGNOSIS — G4733 Obstructive sleep apnea (adult) (pediatric): Secondary | ICD-10-CM

## 2016-02-27 DIAGNOSIS — D352 Benign neoplasm of pituitary gland: Secondary | ICD-10-CM | POA: Diagnosis not present

## 2016-02-27 NOTE — Progress Notes (Signed)
Chief Complaint  Patient presents with  . Bell's Palsy    He is here for worsening symptoms of left-sided facial numbness/tingling and twitching in left eyelid.  He just completed a six day  course of oral steroids for his knee pain.  He had been hopeful it would also help his facial symptoms but they did not improve.      PATIENT: Reginald Collier DOB: May 22, 1966  HISTORY OF PRESENT ILLNESS: HISTORY: Reginald Collier is a 50 years old right-handed male, seen onn Dec 2016, referred by [facility] choice approval for medical care program, primary care physician is [name], MD   I reviewed and summarized office visit, he had a past medical history of hypertension, asthma, works as a Administrator  He woke up on June 08 2015 notice dizziness, surroundings were floating around him, unsteady gait, nausea, denied tinnitus, no hearing loss, he actually presented to emergency room, I personally reviewed CAT scan of the brain that was normal, ever since the event, with sudden positional change, such as lying down, bending over, he had transient dizziness, especially when turning to his right side, over the past 2 weeks, his symptoms overall has improved, but continue has dizziness with sudden positional change.  EEG April 30 2015 was reported as normal.  Ultrasound of carotid artery August 2016: no evidence of hemodynamically significant stenosis  I personally reviewed MRI of the brain without contrast no evidence of acute or subacute infarction, prominent pituitary gland 9.8 mm, stable in size date back to 2009 laboratory showed no significant abnormality on CBC, BMP, UA, negative UDS.  He also reported previous history of sudden onset left arm and leg numbness about 6 months ago in June 2016, has been off work since then, he no longer has left side sensory changes, was diagnosed with TIA.  UPDATE Jul 11 2015: He has some fluctuation of his blood pressure, but has overall been very well regulated,  his dizziness has much improved with repositioning maneuver  UPDATE July 6th 2017: Last visit was with nurse practitioner in April 2017, he complains of daytime sleepiness, fatigue, he has gone through [place] counseling program, he can sleep better, 7-8 hours each night, he did had a history of sleep study, obstructive sleep apnea, using CPAP machine, he works as a Administrator  He had left knee arthroscopic surgery on May 4th 2017, ambulate with a cane right now, he has left Bell's palsy in January 2017, he presented to emergency room August 12 2015, was given prescription of prednisone, valcyclovir, he made almost total recovery in 6 weeks, but since June 2017, he noticed intermittent left facial muscle twitching, around his left eye, left cheek,  He reported had MRI brain was normal, this was done at [place] recently. I do not have formal report.  UPDATE August 17th 2017: He was given diagnosis of TIA in June 2016 with left-sided symptoms, no overall has much improved, but has residual left facial muscle fasciculations, occasionally synergistic movement,  REVIEW OF SYSTEMS: Out of a complete 14 system review of symptoms, the patient complains only of the following symptoms, and all other reviewed systems are negative. Apnea  ALLERGIES: No Known Allergies  HOME MEDICATIONS: Outpatient Medications Prior to Visit  Medication Sig Dispense Refill  . acetaminophen (TYLENOL) 500 MG tablet Take 500-1,000 mg by mouth every 6 (six) hours as needed for moderate pain.    Marland Kitchen albuterol (PROVENTIL HFA;VENTOLIN HFA) 108 (90 BASE) MCG/ACT inhaler Inhale 1-2 puffs into the  lungs every 6 (six) hours as needed for wheezing or shortness of breath.    Marland Kitchen amLODipine (NORVASC) 5 MG tablet Take 5 mg by mouth daily.    Marland Kitchen aspirin 325 MG tablet Take 325 mg by mouth daily.    . chlorthalidone (HYGROTON) 25 MG tablet Take 25 mg by mouth daily.    . GuaiFENesin (HERBAL EXPEC PO) Take 1 each by mouth daily. Herbal tea.    .  polyvinyl alcohol (LIQUIFILM TEARS) 1.4 % ophthalmic solution Place 1 drop into both eyes daily as needed for dry eyes.    . predniSONE (DELTASONE) 20 MG tablet Take 3 tablets (60 mg total) by mouth daily. For 7 days 21 tablet 0  . valACYclovir (VALTREX) 1000 MG tablet Take 1 tablet (1,000 mg total) by mouth 3 (three) times daily. 21 tablet 0   No facility-administered medications prior to visit.     PAST MEDICAL HISTORY: Past Medical History:  Diagnosis Date  . Allergy   . Asthma   . Dizziness   . TIA (transient ischemic attack)     PAST SURGICAL HISTORY: Past Surgical History:  Procedure Laterality Date  . APPENDECTOMY    . HAND TENDON SURGERY    . HERNIA REPAIR    . KNEE SURGERY     scope on both knees    FAMILY HISTORY: Family History  Problem Relation Age of Onset  . Diabetes Mother   . Hypertension Mother   . Cancer Mother   . Alcohol abuse Brother   . Cancer Maternal Grandmother   . Cancer Maternal Grandfather   . Mental illness Sister     SOCIAL HISTORY: Social History   Social History  . Marital status: Single    Spouse name: N/A  . Number of children: 0  . Years of education: BS   Occupational History  . Transportation/Drives truck    Social History Main Topics  . Smoking status: Never Smoker  . Smokeless tobacco: Never Used  . Alcohol use No  . Drug use: No  . Sexual activity: Not on file   Other Topics Concern  . Not on file   Social History Narrative   Lives at home alone.   Left-handed.   No caffeine use.      PHYSICAL EXAM  Vitals:   02/27/16 0832  BP: (!) 140/92  Pulse: 83  Weight: 286 lb (129.7 kg)  Height: 6\' 2"  (1.88 m)   Body mass index is 36.72 kg/m.   PHYSICAL EXAMNIATION:  Gen: NAD, conversant, well nourised, obese, well groomed                     Cardiovascular: Regular rate rhythm, no peripheral edema, warm, nontender. Eyes: Conjunctivae clear without exudates or hemorrhage Neck: Supple, no carotid  bruise. Pulmonary: Clear to auscultation bilaterally   NEUROLOGICAL EXAM:  MENTAL STATUS: Speech:    Speech is normal; fluent and spontaneous with normal comprehension.  Cognition:     Orientation to time, place and person     Normal recent and remote memory     Normal Attention span and concentration     Normal Language, naming, repeating,spontaneous speech     Fund of knowledge   CRANIAL NERVES: CN II: Visual fields are full to confrontation. Fundoscopic exam is normal with sharp discs and no vascular changes. Pupils are round equal and briskly reactive to light. CN III, IV, VI: extraocular movement are normal. No ptosis. CN V: Facial sensation is intact  to pinprick in all 3 divisions bilaterally. Corneal responses are intact.  CN VII: Mild left eye closure weakness occasionally left lower eyelid muscle fasciculation noticed, there is normal bilateral frontalis, cheek puff movement, deeper left labial folder CN VIII: Hearing is normal to rubbing fingers CN IX, X: Palate elevates symmetrically. Phonation is normal. CN XI: Head turning and shoulder shrug are intact CN XII: Tongue is midline with normal movements and no atrophy.  MOTOR: There is no pronator drift of out-stretched arms. Muscle bulk and tone are normal. Muscle strength is normal.  REFLEXES: Reflexes are 2+ and symmetric at the biceps, triceps, knees, and ankles. Plantar responses are flexor.  SENSORY: Intact to light touch, pinprick, positional and vibratory sensation are intact in fingers and toes.  COORDINATION: Rapid alternating movements and fine finger movements are intact. There is no dysmetria on finger-to-nose and heel-knee-shin.    GAIT/STANCE: Antalgic, difficulty bending his left knee    DIAGNOSTIC DATA (LABS, IMAGING, TESTING) - I reviewed patient records, labs, notes, testing and imaging myself where available.  Lab Results  Component Value Date   WBC 7.1 08/12/2015   HGB 16.3 08/12/2015    HCT 48.0 08/12/2015   MCV 90.2 08/12/2015   PLT 304 08/12/2015      Component Value Date/Time   NA 140 08/12/2015 1532   K 4.0 08/12/2015 1532   CL 104 08/12/2015 1532   CO2 26 08/12/2015 1521   GLUCOSE 89 08/12/2015 1532   BUN 14 08/12/2015 1532   CREATININE 1.00 08/12/2015 1532   CREATININE 0.91 05/27/2014 1631   CALCIUM 9.1 08/12/2015 1521   PROT 8.0 08/12/2015 1521   ALBUMIN 4.4 08/12/2015 1521   AST 21 08/12/2015 1521   ALT 29 08/12/2015 1521   ALKPHOS 66 08/12/2015 1521   BILITOT 0.9 08/12/2015 1521   GFRNONAA >60 08/12/2015 1521   GFRAA >60 08/12/2015 1521    ASSESSMENT AND PLAN 50 y.o. year old male  with history of obesity, HTN  Left Bell's palsy  Recovered well,  Occasionally left facial muscle spasm History of TIA  Continue to address vascular risk factors, moderate exercise, lose weight, optimize blood pressure control, blood pressure less than 130/80  Aspirin 325 mg daily Obstructive Sleep apnea  Continue follow-up with sleep specialist at [facility]       Marcial Pacas, M.D. Ph.D.  [facility] [place], [place] [address] Phone: [phone] Fax:      [phone]

## 2016-05-07 ENCOUNTER — Encounter: Payer: Self-pay | Admitting: Adult Health

## 2016-05-11 NOTE — Progress Notes (Signed)
This encounter was created in error - please disregard.

## 2016-05-23 ENCOUNTER — Encounter (HOSPITAL_COMMUNITY): Payer: Self-pay | Admitting: Emergency Medicine

## 2016-05-23 ENCOUNTER — Emergency Department (HOSPITAL_COMMUNITY)
Admission: EM | Admit: 2016-05-23 | Discharge: 2016-05-23 | Disposition: A | Discharge status: Home / Self Care | Payer: No Typology Code available for payment source | Attending: Emergency Medicine | Admitting: Emergency Medicine

## 2016-05-23 ENCOUNTER — Emergency Department (HOSPITAL_COMMUNITY): Payer: No Typology Code available for payment source

## 2016-05-23 DIAGNOSIS — M546 Pain in thoracic spine: Secondary | ICD-10-CM | POA: Insufficient documentation

## 2016-05-23 DIAGNOSIS — M545 Low back pain, unspecified: Secondary | ICD-10-CM

## 2016-05-23 DIAGNOSIS — Y999 Unspecified external cause status: Secondary | ICD-10-CM | POA: Diagnosis not present

## 2016-05-23 DIAGNOSIS — J45909 Unspecified asthma, uncomplicated: Secondary | ICD-10-CM | POA: Diagnosis not present

## 2016-05-23 DIAGNOSIS — Z79899 Other long term (current) drug therapy: Secondary | ICD-10-CM | POA: Insufficient documentation

## 2016-05-23 DIAGNOSIS — Z7982 Long term (current) use of aspirin: Secondary | ICD-10-CM | POA: Diagnosis not present

## 2016-05-23 DIAGNOSIS — Y92410 Unspecified street and highway as the place of occurrence of the external cause: Secondary | ICD-10-CM | POA: Diagnosis not present

## 2016-05-23 DIAGNOSIS — Z8673 Personal history of transient ischemic attack (TIA), and cerebral infarction without residual deficits: Secondary | ICD-10-CM | POA: Insufficient documentation

## 2016-05-23 DIAGNOSIS — Y939 Activity, unspecified: Secondary | ICD-10-CM | POA: Insufficient documentation

## 2016-05-23 MED ORDER — NAPROXEN 500 MG PO TABS
500.0000 mg | ORAL_TABLET | Freq: Two times a day (BID) | ORAL | 0 refills | Status: DC
Start: 2016-05-23 — End: 2023-09-23

## 2016-05-23 MED ORDER — CYCLOBENZAPRINE HCL 10 MG PO TABS: 10.0000 mg | ORAL_TABLET | Freq: Two times a day (BID) | ORAL | 0 refills | Status: DC | PRN

## 2016-05-23 MED ORDER — ACETAMINOPHEN 500 MG PO TABS
1000.0000 mg | ORAL_TABLET | Freq: Once | ORAL | Status: AC
  Administered 2016-05-23: 1000 mg via ORAL
  Filled 2016-05-23: qty 2

## 2016-05-23 MED ORDER — IBUPROFEN 800 MG PO TABS
800.0000 mg | ORAL_TABLET | Freq: Once | ORAL | Status: AC
  Administered 2016-05-23: 800 mg via ORAL
  Filled 2016-05-23: qty 1

## 2016-05-23 MED ORDER — ACETAMINOPHEN 500 MG PO TABS: 1000.0000 mg | ORAL_TABLET | Freq: Four times a day (QID) | ORAL | 0 refills | Status: AC | PRN
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
## 2016-06-09 ENCOUNTER — Encounter: Payer: Self-pay | Admitting: Pulmonary Disease

## 2016-06-10 ENCOUNTER — Encounter: Payer: Self-pay | Admitting: Pulmonary Disease

## 2016-06-10 ENCOUNTER — Ambulatory Visit (INDEPENDENT_AMBULATORY_CARE_PROVIDER_SITE_OTHER): Payer: Non-veteran care | Admitting: Pulmonary Disease

## 2016-06-10 VITALS — BP 122/80 | HR 88 | Ht 74.0 in | Wt 283.6 lb

## 2016-06-10 DIAGNOSIS — G4733 Obstructive sleep apnea (adult) (pediatric): Secondary | ICD-10-CM | POA: Diagnosis not present

## 2016-06-10 DIAGNOSIS — Z6838 Body mass index (BMI) 38.0-38.9, adult: Secondary | ICD-10-CM | POA: Diagnosis not present

## 2016-06-10 DIAGNOSIS — J45909 Unspecified asthma, uncomplicated: Secondary | ICD-10-CM

## 2016-06-10 NOTE — Assessment & Plan Note (Signed)
Weight reduction 

## 2016-06-10 NOTE — Patient Instructions (Signed)
It was a pleasure taking care of you today!  We will schedule you to have a sleep study to determine if you have sleep apnea.   We will get a lab CPAP/BiPaP  sleep study.  You will be scheduled to have a lab sleep study in 4-6 weeks.  Someone from the sleep lab will call you in 2-3 days to schedule the study with you.  They usually have cancellations every night so most likely, they will have openings for a lab sleep study next week or so.  We encourage you to do your sleep study then if possible. Please give [place] a call in a week is no one from the sleep lab calls you in 2-3 days.   We will order you cpap or BipaP depending on that study.  Please call the office if you do NOT receive your machine in the next 1-2 weeks.   Please make sure you use your CPAP device everytime you sleep.  We will monitor the usage of your machine per your insurance requirement.  Your insurance company may take the machine from you if you are not using it regularly.   Please clean the mask, tubings, filter, water reservoir with soapy water every week.  Please use distilled water for the water reservoir.   Please call the office or your machine provider (DME company) if you are having issues with the device.   Return to clinic in 6-8 weeks with Dr. Corrie Dandy or NP

## 2016-06-10 NOTE — Assessment & Plan Note (Signed)
Has exercise induced asthma.  Stable. Prn albuterol.

## 2016-06-10 NOTE — Progress Notes (Signed)
Subjective:    Patient ID: Reginald Collier, male    DOB: 1965/10/03, 50 y.o.   MRN: [id number]  HPI   This is the case of Reginald Collier, 50 y.o. Male, who was referred by Dr.  in consultation regarding OSA.   As you very well know, patient is a non smoker, with exercise induced asthma dxed in 1999 (only on albuetrol prn, roughly 1x/week), on prn singualair, was diagnosed with OSA in 2012.   He had snoring, gasping, choking, witnessed apneas.  Was very sleepy. He had a lab study in [place] in 2012.  He was diagnosed with OSA, unknown severity. He was placed on autocpap 10-20 cm water.   He feels some better.  Still very sleepy in am despite cpap use. Hypersomnia affects his fxnality. He feels the pressure is not enough. Mask might be too small. No recent supplies. He was using the [facility] in [place] to get supplies but he was sent her by the [facility] for further management.   He is not on O2.   DL the last month : 17%, AHI 13.6.   ESS 14.   Review of Systems  Constitutional: Negative.  Negative for fever and unexpected weight change.  HENT: Negative.  Negative for congestion, dental problem, ear pain, nosebleeds, postnasal drip, rhinorrhea, sinus pressure, sneezing, sore throat and trouble swallowing.   Eyes: Positive for redness. Negative for itching.  Respiratory: Negative.  Negative for cough, chest tightness, shortness of breath and wheezing.   Cardiovascular: Negative.  Negative for palpitations and leg swelling.  Gastrointestinal: Positive for nausea. Negative for vomiting.  Endocrine: Negative.   Genitourinary: Negative.  Negative for dysuria.  Musculoskeletal: Positive for joint swelling.  Skin: Negative.  Negative for rash.  Allergic/Immunologic: Positive for environmental allergies.  Neurological: Negative.  Negative for headaches.  Hematological: Negative.  Does not bruise/bleed easily.  Psychiatric/Behavioral: Negative.  Negative for dysphoric mood. The patient is not  nervous/anxious.    Past Medical History:  Diagnosis Date  . Allergy   . Asthma   . Dizziness   . TIA (transient ischemic attack)    (-) CA, DVT  Family History  Problem Relation Age of Onset  . Diabetes Mother   . Hypertension Mother   . Cancer Mother   . Alcohol abuse Brother   . Cancer Maternal Grandmother   . Cancer Maternal Grandfather   . Mental illness Sister     Has PTSD. Sees mental health recently.   Past Surgical History:  Procedure Laterality Date  . APPENDECTOMY    . HAND TENDON SURGERY    . HERNIA REPAIR    . KNEE SURGERY     scope on both knees    Social History   Social History  . Marital status: Single    Spouse name: N/A  . Number of children: 0  . Years of education: BS   Occupational History  . Transportation/Drives truck    Social History Main Topics  . Smoking status: Never Smoker  . Smokeless tobacco: Never Used  . Alcohol use No  . Drug use: No  . Sexual activity: Not on file   Other Topics Concern  . Not on file   Social History Narrative   Lives at home alone.   Left-handed.   No caffeine use.   On disability. (-) ETOH.   No Known Allergies   Outpatient Medications Prior to Visit  Medication Sig Dispense Refill  . acetaminophen (TYLENOL) 500 MG  tablet Take 2 tablets (1,000 mg total) by mouth every 6 (six) hours as needed. 30 tablet 0  . albuterol (PROVENTIL HFA;VENTOLIN HFA) 108 (90 BASE) MCG/ACT inhaler Inhale 1-2 puffs into the lungs every 6 (six) hours as needed for wheezing or shortness of breath.    Marland Kitchen amLODipine (NORVASC) 5 MG tablet Take 5 mg by mouth daily.    Marland Kitchen aspirin 325 MG tablet Take 325 mg by mouth daily.    . cyclobenzaprine (FLEXERIL) 10 MG tablet Take 1 tablet (10 mg total) by mouth 2 (two) times daily as needed for muscle spasms. 20 tablet 0  . GuaiFENesin (HERBAL EXPEC PO) Take 1 each by mouth daily. Herbal tea.    . naproxen (NAPROSYN) 500 MG tablet Take 1 tablet (500 mg total) by mouth 2 (two) times  daily. 30 tablet 0  . polyvinyl alcohol (LIQUIFILM TEARS) 1.4 % ophthalmic solution Place 1 drop into both eyes daily as needed for dry eyes.    . valACYclovir (VALTREX) 1000 MG tablet Take 1 tablet (1,000 mg total) by mouth 3 (three) times daily. 21 tablet 0  . chlorthalidone (HYGROTON) 25 MG tablet Take 25 mg by mouth daily.    . predniSONE (DELTASONE) 20 MG tablet Take 3 tablets (60 mg total) by mouth daily. For 7 days (Patient not taking: Reported on 06/10/2016) 21 tablet 0   No facility-administered medications prior to visit.    No orders of the defined types were placed in this encounter.       Objective:   Physical Exam  Vitals:  Vitals:   06/10/16 1420  BP: 122/80  Pulse: 88  SpO2: 98%  Weight: 283 lb 9.6 oz (128.6 kg)  Height: 6\' 2"  (1.88 m)    Constitutional/General:  Pleasant, well-nourished, well-developed, not in any distress,  Comfortably seating.  Well kempt  Body mass index is 36.41 kg/m. Wt Readings from Last 3 Encounters:  06/10/16 283 lb 9.6 oz (128.6 kg)  02/27/16 286 lb (129.7 kg)  01/16/16 291 lb 12 oz (132.3 kg)    Neck circumference: 19.5 in  HEENT: Pupils equal and reactive to light and accommodation. Anicteric sclerae. Normal nasal mucosa.   No oral  lesions,  mouth clear,  oropharynx clear, no postnasal drip. (-) Oral thrush. No dental caries.  Airway - Mallampati class IV  Neck: No masses. Midline trachea. No JVD, (-) LAD. (-) bruits appreciated.  Respiratory/Chest: Grossly normal chest. (-) deformity. (-) Accessory muscle use.  Symmetric expansion. (-) Tenderness on palpation.  Resonant on percussion.  Diminished BS on both lower lung zones. (-) wheezing, crackles, rhonchi (-) egophony  Cardiovascular: Regular rate and  rhythm, heart sounds normal, no murmur or gallops, no peripheral edema  Gastrointestinal:  Normal bowel sounds. Soft, non-tender. No hepatosplenomegaly.  (-) masses.   Musculoskeletal:  Normal muscle tone. Normal  gait.   Extremities: Grossly normal. (-) clubbing, cyanosis.  (-) edema  Skin: (-) rash,lesions seen.   Neurological/Psychiatric : alert, oriented to time, place, person. Normal mood and affect          Assessment & Plan:  OSA (obstructive sleep apnea) He had snoring, gasping, choking, witnessed apneas.  Was very sleepy. He had a lab study in [place] in 2012.  He was diagnosed with OSA, unknown severity. He was placed on autocpap 10-20 cm water.   He feels some better.  Still very sleepy in am despite cpap use. Hypersomnia affects his fxnality. He feels the pressure is not enough. Mask might  be too small. No recent supplies. He was using the [facility] in [place] to get supplies but he was sent her by the [facility] for further management.   He is not on O2.   DL the last month : 17%, AHI 13.6.   ESS 14.    Plan:  We discussed about the diagnosis of Obstructive Sleep Apnea (OSA) and implications of untreated OSA. We discussed about CPAP and BiPaP as possible treatment options.    Currently, pt is on autocpap 10-20 cm water obtained in 2012 via [place]. Machine is working [employer] per pt but he remains to have hypersomnia and symptomatic.  Unknown severity and his DL showed: noncompliance and AHI 14. Has leak issues, thinks mask is small.  I think pt will need bipap.  Plan for a cpap/bipap study . Once corrected also, he will need an ONO to determine if he will need O2.  Pt denies opiates or benzos use.  Only uses flexeril occasionally.   We will schedule the patient for a sleep study > plan for a cpap/bipap study.    Patient was instructed to call the office if he/she has not heard back from the office 1-2 weeks after the sleep study.   Patient was instructed to call the office if he/she is having issues with the PAP device.   We discussed good sleep hygiene.   Patient was advised not to engage in activities requiring concentration and/or vigilance if he/she is sleepy.  Patient was  advised not to drive if he/she is sleepy.    BMI 38.0-38.9,adult Weight reduction  Asthma Has exercise induced asthma.  Stable. Prn albuterol.     Thank you very much for letting me participate in this patient's care. Please do not hesitate to give me a call if you have any questions or concerns regarding the treatment plan.   Patient will follow up with me in 6-8 weeks.     Monica Becton, MD 06/10/2016   3:06 PM [facility] [place] Pager: [phone] Office: [phone], Fax: [phone]

## 2016-06-10 NOTE — Assessment & Plan Note (Signed)
He had snoring, gasping, choking, witnessed apneas.  Was very sleepy. He had a lab study in [place] in 2012.  He was diagnosed with OSA, unknown severity. He was placed on autocpap 10-20 cm water.   He feels some better.  Still very sleepy in am despite cpap use. Hypersomnia affects his fxnality. He feels the pressure is not enough. Mask might be too small. No recent supplies. He was using the [facility] in [place] to get supplies but he was sent her by the [facility] for further management.   He is not on O2.   DL the last month : 17%, AHI 13.6.   ESS 14.    Plan:  We discussed about the diagnosis of Obstructive Sleep Apnea (OSA) and implications of untreated OSA. We discussed about CPAP and BiPaP as possible treatment options.    Currently, pt is on autocpap 10-20 cm water obtained in 2012 via [place]. Machine is working [employer] per pt but he remains to have hypersomnia and symptomatic.  Unknown severity and his DL showed: noncompliance and AHI 14. Has leak issues, thinks mask is small.  I think pt will need bipap.  Plan for a cpap/bipap study . Once corrected also, he will need an ONO to determine if he will need O2.  Pt denies opiates or benzos use.  Only uses flexeril occasionally.   We will schedule the patient for a sleep study > plan for a cpap/bipap study.    Patient was instructed to call the office if he/she has not heard back from the office 1-2 weeks after the sleep study.   Patient was instructed to call the office if he/she is having issues with the PAP device.   We discussed good sleep hygiene.   Patient was advised not to engage in activities requiring concentration and/or vigilance if he/she is sleepy.  Patient was advised not to drive if he/she is sleepy.

## 2016-06-11 ENCOUNTER — Encounter: Payer: Self-pay | Admitting: Neurology

## 2016-06-11 ENCOUNTER — Other Ambulatory Visit: Payer: Self-pay | Admitting: *Deleted

## 2016-06-11 ENCOUNTER — Ambulatory Visit (INDEPENDENT_AMBULATORY_CARE_PROVIDER_SITE_OTHER): Payer: Non-veteran care | Admitting: Neurology

## 2016-06-11 ENCOUNTER — Telehealth: Payer: Self-pay | Admitting: Neurology

## 2016-06-11 VITALS — BP 164/109 | HR 80 | Ht 74.0 in | Wt 282.5 lb

## 2016-06-11 DIAGNOSIS — G510 Bell's palsy: Secondary | ICD-10-CM | POA: Diagnosis not present

## 2016-06-11 MED ORDER — GABAPENTIN 300 MG PO CAPS: 300.0000 mg | ORAL_CAPSULE | Freq: Three times a day (TID) | ORAL | 11 refills | Status: DC

## 2016-06-11 MED ORDER — GABAPENTIN 300 MG PO CAPS: 300.0000 mg | ORAL_CAPSULE | Freq: Three times a day (TID) | ORAL | 3 refills | Status: AC
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
## 2016-06-11 NOTE — Telephone Encounter (Signed)
Patient is calling stating he was seen today but he forgot to tell Dr. Krista Blue he would like the Rx for gabapentin (NEURONTIN) 300 MG capsule faxed to the [facility] at [phone].

## 2016-06-11 NOTE — Telephone Encounter (Signed)
Returned call to patient - he needs a 90-day rx for his gabapentin faxed into the [facility] pharmacy in [place] at [phone].  This has been completed for him.  Called [facility] to void prescription sent in earlier today.

## 2016-06-17 ENCOUNTER — Ambulatory Visit (HOSPITAL_BASED_OUTPATIENT_CLINIC_OR_DEPARTMENT_OTHER): Payer: No Typology Code available for payment source | Attending: Pulmonary Disease | Admitting: Pulmonary Disease

## 2016-06-17 DIAGNOSIS — G4733 Obstructive sleep apnea (adult) (pediatric): Secondary | ICD-10-CM | POA: Insufficient documentation

## 2016-06-19 ENCOUNTER — Telehealth: Payer: Self-pay | Admitting: Pulmonary Disease

## 2016-06-19 DIAGNOSIS — G4733 Obstructive sleep apnea (adult) (pediatric): Secondary | ICD-10-CM | POA: Diagnosis not present

## 2016-06-19 NOTE — Telephone Encounter (Signed)
  Please call the pt and tell the pt the inlab SLEEP STUDY  Showed he needed bipap.   Please order autoBipaP IPAP  19-23 cm water,  EPAP 15-19 cm water, with a Large size Resmed Full Face Mask AirFit F20 mask and heated humidification.  Patient will need a 1 month download.   Patient needs to be seen by me or any of the NPs/APPs  4-6 weeks after obtaining the bipap machine. Let me know if you receive this.   Thanks!   J. Shirl Harris, MD 06/19/2016, 4:35 PM

## 2016-06-19 NOTE — Procedures (Signed)
    NAME: Reginald Collier DATE OF BIRTH:  October 12, 1965 MEDICAL RECORD NUMBER [id number]  LOCATION: [facility]  PHYSICIAN: Vina  DATE OF STUDY: 06/17/2016  CLINICAL INFORMATION  The patient is referred for a BiPAP titration to treat sleep apnea. Date of NPSG, Split Night or HST:   SLEEP STUDY TECHNIQUE  As per the AASM Manual for the Scoring of Sleep and Associated Events v2.3 (April 2016) with a hypopnea requiring 4% desaturations.  The channels recorded and monitored were frontal, central and occipital EEG, electrooculogram (EOG), submentalis EMG (chin), nasal and oral airflow, thoracic and abdominal wall motion, anterior tibialis EMG, snore microphone, electrocardiogram, and pulse oximetry. Bilevel positive airway pressure (BPAP) was initiated at the beginning of the study and titrated to treat sleep-disordered breathing.  MEDICATIONS  Medications self-administered by patient taken the night of the study : N/A   RESPIRATORY PARAMETERS  Optimal IPAP Pressure (cm): 23 AHI at Optimal Pressure (/hr) 2.5  Optimal EPAP Pressure (cm): 19    Overall Minimal O2 (%): 72.00 Minimal O2 at Optimal Pressure (%): 90.0  SLEEP ARCHITECTURE  Start Time: 10:18:37 PM Stop Time: 4:51:53 AM Total Time (min): 393.3 Total Sleep Time (min): 319.5  Sleep Latency (min): 62.7 Sleep Efficiency (%): 81.2 REM Latency (min): 42.0 WASO (min): 11.1  Stage N1 (%): 3.44 Stage N2 (%): 49.92 Stage N3 (%): 2.50 Stage R (%): 44.13  Supine (%): 100.00 Arousal Index (/hr): 17.5      CARDIAC DATA  The 2 lead EKG demonstrated sinus rhythm. The mean heart rate was 68.15 beats per minute. Other EKG findings include: None.   LEG MOVEMENT DATA  The total Periodic Limb Movements of Sleep (PLMS) were 0. The PLMS index was 0.00. A PLMS index of <15 is considered normal in adults.  IMPRESSIONS  1. Adequate PAP pressure was selected for this patient ( 23 /19 cm of water) with AHI of 2.5 and 21  minutes of REM sleep. Pressure was too much for the patient. 2. Central sleep apnea was not noted during this titration (CAI = 0.0/h). 3. Severe oxygen desaturations were observed during this titration (min O2 = 72.00%). 4. No snoring was audible during this study. 5. No cardiac abnormalities were observed during this study. 6. Clinically significant periodic limb movements were not noted during this study. Arousals associated with PLMs were rare.   DIAGNOSIS  Obstructive Sleep Apnea (327.23 [G47.33 ICD-10])   RECOMMENDATIONS  1. Trial of auto BiPAP therapy on IPAP 19-23 cm water, EPAP 15-19 cm water with a Large size Resmed Full Face Mask AirFit F20 mask and heated humidification. 2. Avoid alcohol, sedatives and other CNS depressants that may worsen sleep apnea and disrupt normal sleep architecture. 3. Sleep hygiene should be reviewed to assess factors that may improve sleep quality. 4. Weight management and regular exercise should be initiated or continued. 5. Return to the office 4-6 weeks after obtaining BiPaP machine.  Monica Becton, MD 06/19/2016, 4:34 PM [facility] Pager [phone] After 3 pm or if no answer, call [phone]

## 2016-06-24 NOTE — Telephone Encounter (Signed)
Pt aware of results and voiced understanding. Order placed for bipap. Pt scheduled with [facility] 08/19/15. Nothing further needed.

## 2016-06-24 NOTE — Telephone Encounter (Signed)
Pt returning call a/bout bipap, he can be reached @ [phone] please advise.Hillery Hunter

## 2016-06-24 NOTE — Telephone Encounter (Signed)
[id number] x 1 

## 2016-07-23 ENCOUNTER — Ambulatory Visit: Payer: Non-veteran care | Admitting: Adult Health

## 2016-08-18 ENCOUNTER — Ambulatory Visit: Payer: Non-veteran care | Admitting: Adult Health

## 2016-08-27 DIAGNOSIS — Z0289 Encounter for other administrative examinations: Secondary | ICD-10-CM

## 2017-02-02 ENCOUNTER — Telehealth: Payer: Self-pay | Admitting: Neurology

## 2017-02-02 NOTE — Telephone Encounter (Signed)
He is having continued problems with Bell's Palsy and is requesting an appt.  He has been added to the schedule on 02/03/17 in an available slot.

## 2017-02-02 NOTE — Telephone Encounter (Signed)
Pt would like a call back from the nurse or Dr. Krista Blue regarding new medical concerns. Best call back is [phone]

## 2017-02-03 ENCOUNTER — Ambulatory Visit (INDEPENDENT_AMBULATORY_CARE_PROVIDER_SITE_OTHER): Payer: Non-veteran care | Admitting: Neurology

## 2017-02-03 ENCOUNTER — Encounter (INDEPENDENT_AMBULATORY_CARE_PROVIDER_SITE_OTHER): Payer: Self-pay

## 2017-02-03 ENCOUNTER — Encounter: Payer: Self-pay | Admitting: Neurology

## 2017-02-03 VITALS — BP 154/99 | HR 80 | Ht 72.5 in | Wt 294.0 lb

## 2017-02-03 DIAGNOSIS — G510 Bell's palsy: Secondary | ICD-10-CM

## 2017-02-03 DIAGNOSIS — R202 Paresthesia of skin: Secondary | ICD-10-CM | POA: Diagnosis not present

## 2017-02-03 DIAGNOSIS — M62838 Other muscle spasm: Secondary | ICD-10-CM | POA: Diagnosis not present

## 2017-02-03 DIAGNOSIS — R29810 Facial weakness: Secondary | ICD-10-CM | POA: Diagnosis not present
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
## 2017-02-17 LAB — PULMONARY FUNCTION TEST

## 2017-04-05 DIAGNOSIS — Z0271 Encounter for disability determination: Secondary | ICD-10-CM

## 2017-04-15 ENCOUNTER — Ambulatory Visit (INDEPENDENT_AMBULATORY_CARE_PROVIDER_SITE_OTHER): Payer: Non-veteran care | Admitting: Pulmonary Disease

## 2017-04-15 ENCOUNTER — Encounter: Payer: Self-pay | Admitting: Pulmonary Disease

## 2017-04-15 ENCOUNTER — Other Ambulatory Visit (INDEPENDENT_AMBULATORY_CARE_PROVIDER_SITE_OTHER): Payer: Non-veteran care

## 2017-04-15 ENCOUNTER — Ambulatory Visit (INDEPENDENT_AMBULATORY_CARE_PROVIDER_SITE_OTHER)
Admission: RE | Admit: 2017-04-15 | Discharge: 2017-04-15 | Disposition: A | Discharge status: Home / Self Care | Payer: Non-veteran care | Source: Ambulatory Visit | Attending: Pulmonary Disease | Admitting: Pulmonary Disease

## 2017-04-15 VITALS — BP 134/78 | HR 81 | Ht 74.0 in | Wt 290.8 lb

## 2017-04-15 DIAGNOSIS — J45909 Unspecified asthma, uncomplicated: Secondary | ICD-10-CM | POA: Diagnosis not present

## 2017-04-15 DIAGNOSIS — G4733 Obstructive sleep apnea (adult) (pediatric): Secondary | ICD-10-CM

## 2017-04-15 DIAGNOSIS — R0602 Shortness of breath: Secondary | ICD-10-CM

## 2017-04-15 LAB — CBC WITH DIFFERENTIAL/PLATELET
Basophils Absolute: 0 10*3/uL (ref 0.0–0.1)
Basophils Relative: 0.6 % (ref 0.0–3.0)
EOS PCT: 2.1 % (ref 0.0–5.0)
Eosinophils Absolute: 0.1 10*3/uL (ref 0.0–0.7)
HCT: 42.1 % (ref 39.0–52.0)
HEMOGLOBIN: 13.8 g/dL (ref 13.0–17.0)
LYMPHS PCT: 29 % (ref 12.0–46.0)
Lymphs Abs: 1.9 10*3/uL (ref 0.7–4.0)
MCHC: 32.9 g/dL (ref 30.0–36.0)
MCV: 91.8 fl (ref 78.0–100.0)
MONOS PCT: 7.2 % (ref 3.0–12.0)
Monocytes Absolute: 0.5 10*3/uL (ref 0.1–1.0)
Neutro Abs: 4.1 10*3/uL (ref 1.4–7.7)
Neutrophils Relative %: 61.1 % (ref 43.0–77.0)
Platelets: 309 10*3/uL (ref 150.0–400.0)
RBC: 4.59 Mil/uL (ref 4.22–5.81)
RDW: 13.1 % (ref 11.5–15.5)
WBC: 6.7 10*3/uL (ref 4.0–10.5)

## 2017-04-15 LAB — NITRIC OXIDE: NITRIC OXIDE: 10

## 2017-04-15 NOTE — Patient Instructions (Signed)
We will check chest x-ray today. Check CBC differential and blood allergy profile Continue using the Combivent as needed. Was started on Breo 200 We will schedule you for pulmonary function tests Return to clinic in 2 months

## 2017-04-15 NOTE — Progress Notes (Addendum)
Reginald Collier    [id number]    Aug 02, 1965  Primary Care Physician:Woods, Mikeal Hawthorne, MD  Referring Physician: Windy Fast, MD [place], [place] [address]  Chief complaint:  Consult for evaluation of asthma  HPI: 51 year old with history of TIA, sleep apnea on BiPAP, exercise-induced asthma, allergies. He was diagnosed with asthma in 1999 when he was in the [employer] at [employer]. He was told that he had exercise-induced asthma. He has since followed up at the [place] where he was started on Combivent. He is using it up to 2 times a day. Does not recall when he had his last lung function test or chest x-ray. Chief complaint today is dyspnea on exertion associated with wheezing. He has some symptoms at rest as well. Denies any cough, sputum production, fevers, chills, nighttime awakenings. He does not have seasonal allergies or GERD symptoms. He has sleep apnea and is on BiPAP. He is tolerating this well with no issues.  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  Outpatient Encounter Prescriptions as of 04/15/2017  Medication Sig  . acetaminophen (TYLENOL) 500 MG tablet Take 2 tablets (1,000 mg total) by mouth every 6 (six) hours as needed.  Marland Kitchen amLODipine (NORVASC) 5 MG tablet Take 5 mg by mouth daily.  Marland Kitchen aspirin 325 MG tablet Take 325 mg by mouth daily.  . chlorthalidone (HYGROTON) 25 MG tablet Take 25 mg by mouth daily.  . cyclobenzaprine (FLEXERIL) 10 MG tablet Take 1 tablet (10 mg total) by mouth 2 (two) times daily as needed for muscle spasms.  Marland Kitchen gabapentin (NEURONTIN) 300 MG capsule Take 1 capsule (300 mg total) by mouth 3 (three) times daily.  . GuaiFENesin (HERBAL EXPEC PO) Take 1 each by mouth daily. Herbal tea.  . Ipratropium-Albuterol (COMBIVENT RESPIMAT) 20-100 MCG/ACT AERS respimat Inhale 1 puff into the lungs every 6 (six) hours.  . naproxen (NAPROSYN) 500 MG tablet  Take 1 tablet (500 mg total) by mouth 2 (two) times daily.  . polyvinyl alcohol (LIQUIFILM TEARS) 1.4 % ophthalmic solution Place 1 drop into both eyes daily as needed for dry eyes.  . [DISCONTINUED] albuterol (PROVENTIL HFA;VENTOLIN HFA) 108 (90 BASE) MCG/ACT inhaler Inhale 1-2 puffs into the lungs every 6 (six) hours as needed for wheezing or shortness of breath.   No facility-administered encounter medications on file as of 04/15/2017.     Allergies as of 04/15/2017  . (No Known Allergies)    Past Medical History:  Diagnosis Date  . Allergy   . Asthma   . Dizziness   . TIA (transient ischemic attack)     Past Surgical History:  Procedure Laterality Date  . APPENDECTOMY    . HAND TENDON SURGERY    . HERNIA REPAIR    . KNEE SURGERY     scope on both knees    Family History  Problem Relation Age of Onset  . Diabetes Mother   . Hypertension Mother   . Cancer Mother   . Alcohol abuse Brother   . Cancer Maternal Grandmother   . Cancer Maternal Grandfather   . Mental illness Sister     Social History   Social History  . Marital status: Single    Spouse name: N/A  . Number of children: 0  . Years of education: BS   Occupational History  . Transportation/Drives truck    Social  History Main Topics  . Smoking status: Never Smoker  . Smokeless tobacco: Never Used  . Alcohol use No  . Drug use: No  . Sexual activity: Not on file   Other Topics Concern  . Not on file   Social History Narrative   Lives at home alone.   Left-handed.   No caffeine use.    Review of systems: Review of Systems  Constitutional: Negative for fever and chills.  HENT: Negative.   Eyes: Negative for blurred vision.  Respiratory: as per HPI  Cardiovascular: Negative for chest pain and palpitations.  Gastrointestinal: Negative for vomiting, diarrhea, blood per rectum. Genitourinary: Negative for dysuria, urgency, frequency and hematuria.  Musculoskeletal: Negative for myalgias, back  pain and joint pain.  Skin: Negative for itching and rash.  Neurological: Negative for dizziness, tremors, focal weakness, seizures and loss of consciousness.  Endo/Heme/Allergies: Negative for environmental allergies.  Psychiatric/Behavioral: Negative for depression, suicidal ideas and hallucinations.  All other systems reviewed and are negative.  Physical Exam: Blood pressure 134/78, pulse 81, height 6\' 2"  (1.88 m), weight 131.9 kg (290 lb 12.8 oz), SpO2 96 %. Gen:      No acute distress HEENT:  EOMI, sclera anicteric Neck:     No masses; no thyromegaly Lungs:    Clear to auscultation bilaterally; normal respiratory effort CV:         Regular rate and rhythm; no murmurs Abd:      + bowel sounds; soft, non-tender; no palpable masses, no distension Ext:    No edema; adequate peripheral perfusion Skin:      Warm and dry; no rash Neuro: alert and oriented x 3 Psych: normal mood and affect  Data Reviewed: FENO 04/15/17- 10  BiPAP titration study 06/17/16 Recommended Trial of auto BiPAP therapy on IPAP 19-23 cm water, EPAP 15-19 cm water   Assessment:  Asthma, exercise-induced Appears to have worsening symptoms. Will evaluate by getting a chest x-ray, pulmonary function tests with bronchodilator response. Check CBC with differential and blood allergy profile Start him on controller medication with Breo 200/45. I asked him to continue the Combivent on a PRN basis.  OSA  He has been on BiPAP for the past year. He appears to be tolerating it well with no issues. Check download at return visit.  Plan/Recommendations: - [employer], continue Combivent when necessary - Check chest x-ray, PFTs, CBC with differential, blood allergy profile - Continue BiPAP for sleep apnea.  Marshell Garfinkel MD [facility] Pager [phone] 04/15/2017, 4:40 PM  CC: Windy Fast, MD  Addendum: Received and reviewed records from [facility]  Chest x-ray 03/30/17-normal chest  PFTs  02/17/17 FVC 3.55 (74%], FEV1 2.94 (77%], F/F 83 FEF 25-75%- 2.58 [90%], postbronchodilator 3.78 [102%] TLC 67% DLCO 89% Moderate restriction with small airways disease  PFTs 11/20/15 FVC 3.48 [92%], FEV1 2.92 [95%], F/F 84, FEF 25-75% 3.35 [89%] TLC 69% DLCO 74% Moderate restriction with mild diffusion impairment.  Spirometry 10/22/11 FVC 3.54 [62%], FEV1 2.92 [63%], F/F 83 No obstruction, restriction possible.  Spirometry 01/11/07 FVC 3.81 [90%], FEV1 3.28 [73%], F/F 86 No obstruction, restriction possible.  Sleep study 11/19/16 On BiPAP 19/15-3.25 hours of usage with AHI 1.1 Periods of bruxism noted  Marshell Garfinkel MD  Pulmonary and Critical Care Pager [phone] If no answer or after 3pm call: [phone] 05/11/2017, 12:17 PM

## 2017-04-16 ENCOUNTER — Telehealth: Payer: Self-pay | Admitting: Pulmonary Disease

## 2017-04-16 LAB — RESPIRATORY ALLERGY PROFILE REGION II ~~LOC~~
ALLERGEN, D PTERNOYSSINUS, D1: 0.31 kU/L — AB
Allergen, Cedar tree, t12: <0.1 kU/L
Allergen, Comm Silver Birch, t9: <0.1 kU/L
Allergen, Mulberry, t76: <0.1 kU/L
Allergen, Oak,t7: <0.1 kU/L
Allergen, P. notatum, m1: <0.1 kU/L
Aspergillus fumigatus, m3: 0.12 kU/L — ABNORMAL HIGH
CLADOSPORIUM HERBARUM (M2) IGE: <0.1 kU/L
CLASS: 0
CLASS: 0
CLASS: 0
CLASS: 0
CLASS: 0
CLASS: 0
CLASS: 0
CLASS: 0
CLASS: 0
CLASS: 1
COMMON RAGWEED (SHORT) (W1) IGE: <0.1 kU/L
Cat Dander: <0.1 kU/L
Class: 0
Class: 0
Class: 0
Class: 0
Class: 0
Class: 0
Class: 0
Class: 0
Class: 0
Class: 0
Class: 0
Class: 0
Class: 0
Class: 1
Cockroach: <0.1 kU/L
D. FARINAE: 0.38 kU/L — AB
IGE (IMMUNOGLOBULIN E), SERUM: 255 kU/L — AB (ref <=114)
Pecan/Hickory Tree IgE: <0.1 kU/L
Rough Pigweed  IgE: <0.1 kU/L
TIMOTHY GRASS: 0.37 kU/L — AB

## 2017-04-16 LAB — INTERPRETATION:

## 2017-04-16 MED ORDER — FLUTICASONE FUROATE-VILANTEROL 200-25 MCG/INH IN AEPB: 1.0000 | INHALATION_SPRAY | Freq: Every day | RESPIRATORY_TRACT | 11 refills | Status: AC

## 2017-04-16 MED ORDER — BUDESONIDE-FORMOTEROL FUMARATE 160-4.5 MCG/ACT IN AERO: 2.0000 | INHALATION_SPRAY | Freq: Two times a day (BID) | RESPIRATORY_TRACT | 6 refills | Status: AC

## 2017-04-16 NOTE — Telephone Encounter (Signed)
We can try symbicort 160/4.5. Can the pt find from the [place] which inhalers are on formulary? Thanks  Marshell Garfinkel MD [facility] 04/16/2017, 5:12 PM

## 2017-04-16 NOTE — Telephone Encounter (Signed)
Spoke with pt, advised I faxed over the Rx to [facility] at [phone]. Pt understood and nothing further is needed.

## 2017-04-16 NOTE — Telephone Encounter (Signed)
Spoke with pt, he states the [facility] does not have Breo and would like an alternative sent in. PM can we send in an alternate? No fax number given for [facility]. Pt did not have any information on how to send the new Rx. PM please advise.

## 2017-04-21 ENCOUNTER — Telehealth: Payer: Self-pay | Admitting: Pulmonary Disease

## 2017-04-21 NOTE — Telephone Encounter (Signed)
Spoke with patient. He was unsure about how to use the Symbicort 160 inhaler. Advised patient that he should take 2 puffs in the morning and 2 puffs in the evening. Advised him to rinse his mouth out after each use. Patient had a spacer with him and wanted to know if he could use that with the Symbicort.   Patient verbalized understanding. Nothing else needed at time of call.

## 2017-04-28 ENCOUNTER — Ambulatory Visit: Payer: Non-veteran care | Admitting: Pulmonary Disease

## 2017-05-19 ENCOUNTER — Ambulatory Visit (INDEPENDENT_AMBULATORY_CARE_PROVIDER_SITE_OTHER): Payer: Non-veteran care | Admitting: Pulmonary Disease

## 2017-05-19 DIAGNOSIS — J45909 Unspecified asthma, uncomplicated: Secondary | ICD-10-CM

## 2017-05-19 DIAGNOSIS — R0602 Shortness of breath: Secondary | ICD-10-CM | POA: Diagnosis not present

## 2017-05-19 LAB — PULMONARY FUNCTION TEST
DL/VA % PRED: 143 %
DL/VA: 6.8 ml/min/mmHg/L
DLCO cor % pred: 85 %
DLCO cor: 29.89 ml/min/mmHg
DLCO unc % pred: 80 %
DLCO unc: 28.35 ml/min/mmHg
FEF 25-75 POST: 2.57 L/s
FEF 25-75 PRE: 2.83 L/s
FEF2575-%Change-Post: -8 %
FEF2575-%PRED-POST: 73 %
FEF2575-%Pred-Pre: 80 %
FEV1-%CHANGE-POST: 1 %
FEV1-%Pred-Post: 65 %
FEV1-%Pred-Pre: 65 %
FEV1-PRE: 2.33 L
FEV1-Post: 2.35 L
FEV1FVC-%Change-Post: 0 %
FEV1FVC-%PRED-PRE: 105 %
FEV6-%CHANGE-POST: 0 %
FEV6-%PRED-POST: 63 %
FEV6-%Pred-Pre: 63 %
FEV6-Post: 2.77 L
FEV6-Pre: 2.76 L
FEV6FVC-%CHANGE-POST: 0 %
FEV6FVC-%Pred-Post: 103 %
FEV6FVC-%Pred-Pre: 102 %
FVC-%Change-Post: 0 %
FVC-%PRED-POST: 61 %
FVC-%Pred-Pre: 61 %
FVC-Post: 2.77 L
FVC-Pre: 2.76 L
POST FEV1/FVC RATIO: 85 %
Post FEV6/FVC ratio: 100 %
Pre FEV1/FVC ratio: 84 %
Pre FEV6/FVC Ratio: 100 %

## 2017-05-19 NOTE — Progress Notes (Signed)
PFT done today. 

## 2017-06-15 ENCOUNTER — Encounter: Payer: Self-pay | Admitting: Pulmonary Disease

## 2017-06-15 ENCOUNTER — Telehealth: Payer: Self-pay | Admitting: Pulmonary Disease

## 2017-06-15 ENCOUNTER — Ambulatory Visit (INDEPENDENT_AMBULATORY_CARE_PROVIDER_SITE_OTHER): Payer: Non-veteran care | Admitting: Pulmonary Disease

## 2017-06-15 VITALS — BP 136/80 | HR 100 | Ht 74.0 in | Wt 296.0 lb

## 2017-06-15 DIAGNOSIS — G4733 Obstructive sleep apnea (adult) (pediatric): Secondary | ICD-10-CM | POA: Diagnosis not present

## 2017-06-15 DIAGNOSIS — R0602 Shortness of breath: Secondary | ICD-10-CM

## 2017-06-15 MED ORDER — ALBUTEROL SULFATE HFA 108 (90 BASE) MCG/ACT IN AERS: 2.0000 | INHALATION_SPRAY | Freq: Four times a day (QID) | RESPIRATORY_TRACT | 6 refills | Status: DC | PRN

## 2017-06-15 MED ORDER — AZITHROMYCIN 250 MG PO TABS: ORAL_TABLET | ORAL | 0 refills | Status: DC

## 2017-06-15 MED ORDER — AZITHROMYCIN 250 MG PO TABS
ORAL_TABLET | ORAL | 0 refills | Status: AC
Start: 2017-06-15 — End: 2017-06-20

## 2017-06-15 MED ORDER — ALBUTEROL SULFATE HFA 108 (90 BASE) MCG/ACT IN AERS
2.0000 | INHALATION_SPRAY | Freq: Four times a day (QID) | RESPIRATORY_TRACT | 6 refills | Status: AC | PRN
Start: 2017-06-15 — End: ?

## 2017-06-15 NOTE — Telephone Encounter (Signed)
Faxed to the number above. I called pt to let him know, lm to call back if they did not receive fax. Nothing further is needed.

## 2017-06-15 NOTE — Patient Instructions (Addendum)
Stop the Breo and Symbicort.  Just use albuterol as needed before exertion and exercise We will schedule you for a cardiopulmonary exercise test for evaluation of exercise-induced asthma Please continue using the BiPAP for sleep apnea

## 2017-06-15 NOTE — Progress Notes (Addendum)
Reginald Collier    [id number]    [birth year]  Primary Care Physician:Woods, Mikeal Hawthorne, MD  Referring Physician: Windy Fast, MD [place], [place] [id number]  Chief complaint:  Follow up for exercise induced asthma  HPI: 51 year old with history of TIA, sleep apnea on BiPAP, exercise-induced asthma, allergies. He was diagnosed with asthma in 1999 when he was in the [employer] at [employer]. He was told that he had exercise-induced asthma. He has since followed up at the [place] where he was started on Combivent. He is using it up to 2 times a day. Does not recall when he had his last lung function test or chest x-ray. Chief complaint today is dyspnea on exertion associated with wheezing. He has some symptoms at rest as well. Denies any cough, sputum production, fevers, chills, nighttime awakenings. He does not have seasonal allergies or GERD symptoms. He has sleep apnea and is on BiPAP. He is tolerating this well with no issues.  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  Interim history: He was given Symbicort at last visit.  He feels that this actually made his breathing worse.  Still has dyspnea, cough with congestion.  Chief complaint today is cough with congestion, yellow mucus.  He reports flecks of blood on occasion.  Outpatient Encounter Medications as of 06/15/2017  Medication Sig  . acetaminophen (TYLENOL) 500 MG tablet Take 2 tablets (1,000 mg total) by mouth every 6 (six) hours as needed.  Marland Kitchen amLODipine (NORVASC) 5 MG tablet Take 5 mg by mouth daily.  Marland Kitchen aspirin 325 MG tablet Take 325 mg by mouth daily.  . budesonide-formoterol (SYMBICORT) 160-4.5 MCG/ACT inhaler Inhale 2 puffs into the lungs 2 (two) times daily.  . chlorthalidone (HYGROTON) 25 MG tablet Take 25 mg by mouth daily.  . cyclobenzaprine (FLEXERIL) 10 MG tablet Take 1 tablet (10 mg total) by mouth 2 (two) times  daily as needed for muscle spasms.  . fluticasone furoate-vilanterol (BREO ELLIPTA) 200-25 MCG/INH AEPB Inhale 1 puff into the lungs daily.  Marland Kitchen gabapentin (NEURONTIN) 300 MG capsule Take 1 capsule (300 mg total) by mouth 3 (three) times daily.  . GuaiFENesin (HERBAL EXPEC PO) Take 1 each by mouth daily. Herbal tea.  . Ipratropium-Albuterol (COMBIVENT RESPIMAT) 20-100 MCG/ACT AERS respimat Inhale 1 puff into the lungs every 6 (six) hours.  . naproxen (NAPROSYN) 500 MG tablet Take 1 tablet (500 mg total) by mouth 2 (two) times daily.  . polyvinyl alcohol (LIQUIFILM TEARS) 1.4 % ophthalmic solution Place 1 drop into both eyes daily as needed for dry eyes.   No facility-administered encounter medications on file as of 06/15/2017.     Allergies as of 06/15/2017  . (No Known Allergies)    Past Medical History:  Diagnosis Date  . Allergy   . Asthma   . Dizziness   . TIA (transient ischemic attack)     Past Surgical History:  Procedure Laterality Date  . APPENDECTOMY    . HAND TENDON SURGERY    . HERNIA REPAIR    . KNEE SURGERY     scope on both knees    Family History  Problem Relation Age of Onset  . Diabetes Mother   . Hypertension Mother   . Cancer Mother   . Alcohol abuse Brother   . Cancer Maternal Grandmother   . Cancer Maternal Grandfather   .  Mental illness Sister     Social History   Socioeconomic History  . Marital status: Single    Spouse name: Not on file  . Number of children: 0  . Years of education: BS  . Highest education level: Not on file  Social Needs  . Financial resource strain: Not on file  . Food insecurity - worry: Not on file  . Food insecurity - inability: Not on file  . Transportation needs - medical: Not on file  . Transportation needs - non-medical: Not on file  Occupational History  . Occupation: Transportation/Drives truck  Tobacco Use  . Smoking status: Never Smoker  . Smokeless tobacco: Never Used  Substance and Sexual Activity  .  Alcohol use: No    Alcohol/week: 0.0 oz  . Drug use: No  . Sexual activity: Not on file  Other Topics Concern  . Not on file  Social History Narrative   Lives at home alone.   Left-handed.   No caffeine use.    Review of systems: Review of Systems  Constitutional: Negative for fever and chills.  HENT: Negative.   Eyes: Negative for blurred vision.  Respiratory: as per HPI  Cardiovascular: Negative for chest pain and palpitations.  Gastrointestinal: Negative for vomiting, diarrhea, blood per rectum. Genitourinary: Negative for dysuria, urgency, frequency and hematuria.  Musculoskeletal: Negative for myalgias, back pain and joint pain.  Skin: Negative for itching and rash.  Neurological: Negative for dizziness, tremors, focal weakness, seizures and loss of consciousness.  Endo/Heme/Allergies: Negative for environmental allergies.  Psychiatric/Behavioral: Negative for depression, suicidal ideas and hallucinations.  All other systems reviewed and are negative.  Physical Exam: Blood pressure 136/80, pulse 100, height 6\' 2"  (1.88 m), weight 296 lb (134.3 kg), SpO2 99 %. Gen:      No acute distress HEENT:  EOMI, sclera anicteric Neck:     No masses; no thyromegaly Lungs:    Clear to auscultation bilaterally; normal respiratory effort CV:         Regular rate and rhythm; no murmurs Abd:      + bowel sounds; soft, non-tender; no palpable masses, no distension Ext:    No edema; adequate peripheral perfusion Skin:      Warm and dry; no rash Neuro: alert and oriented x 3 Psych: normal mood and affect  Data Reviewed: FENO 04/15/17- 10  BiPAP titration study 06/17/16 Recommended Trial of auto BiPAP therapy on IPAP 19-23 cm water, EPAP 15-19 cm water   CBC 04/15/13-WBC 6.7, Eos 2.1%, absolute eos count 100 Blood allergy profile 04/15/17-IgE 55, Rast panel showed sensitivity to grass, dust mite.  Chest x-ray 04/15/17-no acute abnormality.  Received and reviewed records from [place] [facility]  x-ray 03/30/17-normal chest  PFTs 02/17/17 FVC 3.55 (74%], FEV1 2.94 (77%], F/F 83 FEF 25-75%- 2.58 [90%], postbronchodilator 3.78 [102%] TLC 67% DLCO 89% Moderate restriction with small airways disease  PFTs 11/20/15 FVC 3.48 [92%], FEV1 2.92 [95%], F/F 84, FEF 25-75% 3.35 [89%] TLC 69% DLCO 74% Moderate restriction with mild diffusion impairment.  Spirometry 10/22/11 FVC 3.54 [62%], FEV1 2.92 [63%], F/F 83 No obstruction, restriction possible.  Spirometry 01/11/07 FVC 3.81 [90%], FEV1 3.28 [73%], F/F 86 No obstruction, restriction possible.  Sleep study 11/19/16 On BiPAP 19/15-3.25 hours of usage with AHI 1.1 Periods of bruxism noted  Assessment:  Asthma, exercise-induced Appears to have worsening symptoms. There is no evidence that he has regular asthma.  Since he has not responded to Symbicort we will stop it. It is possible  that he may have exercise-induced asthma as he was previously diagnosed after an cardiopulmonary exercise stress test at [facility].  We will repeat this for evaluation. I suspect some of his problems may be from weight gain and deconditioning and encouraged him to diet, exercise to lose weight.   Bronchitis Give him a Z-Pak If he has worsening blood in the sputum he will call back for further evaluation.  OSA  He has been on BiPAP for the past year. He appears to be tolerating it well with no issues. Check download at return visit.  Plan/Recommendations: - Z pack -Stop Symbicort.  Use albuterol as needed before exercise -We will schedule you for a cardiopulmonary exercise test -Continue using the BiPAP at night-  Marshell Garfinkel MD [facility] Pager [phone] 06/15/2017, 2:13 PM  CC: Windy Fast, MD  Marshell Garfinkel MD [facility] Pager [phone] If no answer or after 3pm call: [phone] 06/15/2017, 2:13 PM

## 2017-06-22 ENCOUNTER — Encounter (HOSPITAL_COMMUNITY): Payer: Non-veteran care

## 2017-06-23 ENCOUNTER — Encounter (HOSPITAL_COMMUNITY): Payer: Non-veteran care

## 2017-09-04 ENCOUNTER — Emergency Department (HOSPITAL_COMMUNITY)
Admission: EM | Admit: 2017-09-04 | Discharge: 2017-09-04 | Disposition: A | Discharge status: Home / Self Care | Payer: No Typology Code available for payment source | Attending: Emergency Medicine | Admitting: Emergency Medicine

## 2017-09-04 ENCOUNTER — Emergency Department (HOSPITAL_COMMUNITY): Payer: No Typology Code available for payment source

## 2017-09-04 ENCOUNTER — Encounter (HOSPITAL_COMMUNITY): Payer: Self-pay | Admitting: Student

## 2017-09-04 DIAGNOSIS — Z79899 Other long term (current) drug therapy: Secondary | ICD-10-CM | POA: Diagnosis not present

## 2017-09-04 DIAGNOSIS — Z8673 Personal history of transient ischemic attack (TIA), and cerebral infarction without residual deficits: Secondary | ICD-10-CM | POA: Diagnosis not present

## 2017-09-04 DIAGNOSIS — Y939 Activity, unspecified: Secondary | ICD-10-CM | POA: Insufficient documentation

## 2017-09-04 DIAGNOSIS — J45909 Unspecified asthma, uncomplicated: Secondary | ICD-10-CM | POA: Insufficient documentation

## 2017-09-04 DIAGNOSIS — M549 Dorsalgia, unspecified: Secondary | ICD-10-CM | POA: Diagnosis not present

## 2017-09-04 DIAGNOSIS — Y92410 Unspecified street and highway as the place of occurrence of the external cause: Secondary | ICD-10-CM | POA: Insufficient documentation

## 2017-09-04 DIAGNOSIS — Z7982 Long term (current) use of aspirin: Secondary | ICD-10-CM | POA: Diagnosis not present

## 2017-09-04 DIAGNOSIS — M25562 Pain in left knee: Secondary | ICD-10-CM | POA: Diagnosis not present

## 2017-09-04 DIAGNOSIS — Y998 Other external cause status: Secondary | ICD-10-CM | POA: Diagnosis not present

## 2017-09-04 DIAGNOSIS — M25561 Pain in right knee: Secondary | ICD-10-CM | POA: Diagnosis not present

## 2017-09-04 MED ORDER — METHOCARBAMOL 500 MG PO TABS: 500.0000 mg | ORAL_TABLET | Freq: Three times a day (TID) | ORAL | 0 refills | Status: AC | PRN

## 2017-09-04 MED ORDER — OXYCODONE-ACETAMINOPHEN 5-325 MG PO TABS
1.0000 | ORAL_TABLET | Freq: Once | ORAL | Status: AC
  Administered 2017-09-04: 1 via ORAL
  Filled 2017-09-04: qty 1
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
## 2017-09-07 ENCOUNTER — Telehealth: Payer: Self-pay | Admitting: Neurology
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
## 2017-09-22 ENCOUNTER — Encounter: Payer: Self-pay | Admitting: Neurology

## 2017-09-22 ENCOUNTER — Ambulatory Visit (INDEPENDENT_AMBULATORY_CARE_PROVIDER_SITE_OTHER): Payer: Non-veteran care | Admitting: Neurology

## 2017-09-22 DIAGNOSIS — H9313 Tinnitus, bilateral: Secondary | ICD-10-CM | POA: Diagnosis not present

## 2017-09-22 DIAGNOSIS — H9319 Tinnitus, unspecified ear: Secondary | ICD-10-CM | POA: Insufficient documentation
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
## 2017-10-11 ENCOUNTER — Encounter: Payer: Self-pay | Admitting: Gastroenterology

## 2018-03-20 ENCOUNTER — Encounter (HOSPITAL_COMMUNITY): Payer: Self-pay

## 2018-03-20 ENCOUNTER — Ambulatory Visit (HOSPITAL_COMMUNITY)
Admission: EM | Admit: 2018-03-20 | Discharge: 2018-03-20 | Disposition: A | Discharge status: Home / Self Care | Payer: Non-veteran care | Attending: Internal Medicine | Admitting: Internal Medicine

## 2018-03-20 DIAGNOSIS — S00512A Abrasion of oral cavity, initial encounter: Secondary | ICD-10-CM | POA: Diagnosis not present

## 2018-03-20 MED ORDER — CHLORHEXIDINE GLUCONATE 0.12 % MT SOLN: 15.0000 mL | Freq: Two times a day (BID) | OROMUCOSAL | 0 refills | Status: DC

## 2018-03-20 NOTE — ED Triage Notes (Signed)
Pt presents with foreign body in tongue from food ( may be glass or bone)

## 2018-03-20 NOTE — ED Provider Notes (Signed)
I am seeing patient in conjunction with PA Wieters.  Patient reports that he has a foreign body in the roof of his mouth from eating fried fish and potatoes.  He reports that he had bleeding shortly after biting into the food and had to spit everything out, wash his mouth out.  He denies seeing any foreign body in the food himself.  However he continues to have pain on the roof his mouth and foreign body sensation.  On exam, patient does have 2 small abrasions approximately less than half centimeter each on either side of the hard palate.  There is no obvious foreign body or penetrating wound.  I am in agreement with my colleague PA Joneen Caraway. patient reports that one of his primary concerns is infection.  Therefore I counseled that he could use salt water gargle and chlorhexidine mouth rinse.  ER and return to clinic precautions reviewed.   Jaynee Eagles, [place] 03/21/18 [phone]

## 2018-03-20 NOTE — Discharge Instructions (Addendum)
You may take 500mg  Tylenol every 6 hours for pain and inflammation. If you develop swelling, difficulty breathing, fever, chest tightness, then please report to the ER.

## 2018-03-21 NOTE — ED Provider Notes (Signed)
[place]    CSN: [id number] Arrival date & time: 03/20/18  1429     History   Chief Complaint Chief Complaint  Patient presents with  . Think he may have swallowed glass from food    HPI Reginald Collier is a 52 y.o. male no contributing past medical history presenting today for evaluation of possible pole foreign body in mouth.  Patient states that he was eating at a restaurant earlier today, eating fried fish and all of a sudden he felt as if something was stuck in either his tongue or the roof of his mouth.  Since he has had an irritation sensation and occasional gagging.  He denies any shortness of breath, difficulty breathing, sensation of something stuck in his throat or difficulty swallowing.  States that the fish did not have bones any attempted to search through his food to find any possible cause of his symptoms, but was unable to find anything.  He is concerned about something being stuck in his mouth that he may further swallowing gets stuck further down and cause more damage.  He came straight here after eating at the restaurant.  He tried gargling salt water, but symptoms persisted.  HPI  Past Medical History:  Diagnosis Date  . Allergy   . Asthma   . Dizziness   . TIA (transient ischemic attack)     Patient Active Problem List   Diagnosis Date Noted  . Tinnitus 09/22/2017  . Paresthesia 02/03/2017  . Muscle spasm 02/03/2017  . Facial muscle weakness 02/03/2017  . Asthma 06/10/2016  . Left-sided Bell's palsy 01/16/2016  . Benign paroxysmal positional vertigo 06/17/2015  . Pituitary adenoma ([place]) 06/17/2015  . BMI 38.0-38.9,adult 05/27/2014  . OSA (obstructive sleep apnea) 05/27/2014  . Osteoarthritis of both knees 05/27/2014  . Allergic rhinitis 10/12/2012    Past Surgical History:  Procedure Laterality Date  . APPENDECTOMY    . HAND TENDON SURGERY    . HERNIA REPAIR    . KNEE SURGERY     scope on both knees       Home Medications     Prior to Admission medications   Medication Sig Start Date End Date Taking? Authorizing Provider  acetaminophen (TYLENOL) 500 MG tablet Take 2 tablets (1,000 mg total) by mouth every 6 (six) hours as needed. 05/23/16   Gloriann Loan, PA-C  albuterol (PROVENTIL HFA;VENTOLIN HFA) 108 (90 Base) MCG/ACT inhaler Inhale 2 puffs into the lungs every 6 (six) hours as needed for wheezing or shortness of breath. 06/15/17   Mannam, Hart Robinsons, MD  albuterol (VENTOLIN HFA) 108 (90 Base) MCG/ACT inhaler Inhale 2 puffs into the lungs every 6 (six) hours as needed for wheezing or shortness of breath.    [provider]  amLODipine (NORVASC) 5 MG tablet Take 5 mg by mouth daily.    [provider]  budesonide-formoterol (SYMBICORT) 160-4.5 MCG/ACT inhaler Inhale 2 puffs into the lungs 2 (two) times daily. 04/16/17   Mannam, Hart Robinsons, MD  chlorhexidine (PERIDEX) 0.12 % solution Use as directed 15 mLs in the mouth or throat 2 (two) times daily. 03/20/18   Jaynee Eagles, PA-C  chlorthalidone (HYGROTON) 25 MG tablet Take 25 mg by mouth daily.    [provider]  clopidogrel (PLAVIX) 75 MG tablet Take 75 mg by mouth daily.    [provider]  cyclobenzaprine (FLEXERIL) 10 MG tablet Take 1 tablet (10 mg total) by mouth 2 (two) times daily as needed for muscle spasms. 05/23/16  Gloriann Loan, PA-C  fluticasone furoate-vilanterol (BREO ELLIPTA) 200-25 MCG/INH AEPB Inhale 1 puff into the lungs daily. 04/16/17   Mannam, Hart Robinsons, MD  gabapentin (NEURONTIN) 300 MG capsule Take 1 capsule (300 mg total) by mouth 3 (three) times daily. 06/11/16   Marcial Pacas, MD  GuaiFENesin (HERBAL EXPEC PO) Take 1 each by mouth daily. Herbal tea.    [provider]  Ipratropium-Albuterol (COMBIVENT RESPIMAT) 20-100 MCG/ACT AERS respimat Inhale 1 puff into the lungs every 6 (six) hours.    [provider]  methocarbamol (ROBAXIN) 500 MG tablet Take 1 tablet (500 mg total) by mouth every 8 (eight) hours as  needed for muscle spasms. 09/04/17   Petrucelli, Samantha R, PA-C  naproxen (NAPROSYN) 500 MG tablet Take 1 tablet (500 mg total) by mouth 2 (two) times daily. 05/23/16   Gloriann Loan, PA-C  polyvinyl alcohol (LIQUIFILM TEARS) 1.4 % ophthalmic solution Place 1 drop into both eyes daily as needed for dry eyes.    [provider]    Family History Family History  Problem Relation Age of Onset  . Diabetes Mother   . Hypertension Mother   . Cancer Mother   . Alcohol abuse Brother   . Cancer Maternal Grandmother   . Cancer Maternal Grandfather   . Mental illness Sister     Social History Social History   Tobacco Use  . Smoking status: Never Smoker  . Smokeless tobacco: Never Used  Substance Use Topics  . Alcohol use: No    Alcohol/week: 0.0 standard drinks  . Drug use: No     Allergies   Patient has no known allergies.   Review of Systems Review of Systems  Constitutional: Negative for activity change, appetite change, chills, fatigue and fever.  HENT: Positive for mouth sores. Negative for congestion, ear pain, rhinorrhea, sore throat and trouble swallowing.   Respiratory: Negative for cough, chest tightness and shortness of breath.   Cardiovascular: Negative for chest pain.  Gastrointestinal: Negative for abdominal pain, diarrhea, nausea and vomiting.  Musculoskeletal: Negative for myalgias.  Skin: Positive for wound.  Neurological: Negative for dizziness, speech difficulty, light-headedness and headaches.     Physical Exam Triage Vital Signs ED Triage Vitals [03/20/18 1529]  Enc Vitals Group     BP (!) 146/98     Pulse Rate 72     Resp 20     Temp 98.1 F (36.7 C)     Temp Source Temporal     SpO2 99 %     Weight      Height      Head Circumference      Peak Flow      Pain Score      Pain Loc      Pain Edu?      Excl. in [place]?    No data found.  Updated Vital Signs BP (!) 146/98 (BP Location: Right Arm)   Pulse 72   Temp 98.1 F (36.7 C)  (Temporal)   Resp 20   SpO2 99%   Visual Acuity Right Eye Distance:   Left Eye Distance:   Bilateral Distance:    Right Eye Near:   Left Eye Near:    Bilateral Near:     Physical Exam  Constitutional: He is oriented to person, place, and time. He appears well-developed and well-nourished.  No acute distress  HENT:  Head: Normocephalic and atraumatic.  Nose: Nose normal.  Posterior oropharynx patent, no uvula swelling or deviation, buccal mucosa without  signs of abrasions or foreign body, tongue normal pink coloration, no abrasion or foreign body visualized to the top, sides and bottom of tongue, roof of mouth with 2 small superficial abrasions, see picture below, no obvious foreign body visualized in this area  Occasional spitting/gagging of spit in room after looking in mouth  Eyes: Conjunctivae are normal.  Neck: Neck supple.  Cardiovascular: Normal rate.  Pulmonary/Chest: Effort normal. No respiratory distress.  Breathing comfortably at rest, CTABL, no wheezing, rales or other adventitious sounds auscultated  Speaking in full sentences without difficulty  Abdominal: He exhibits no distension.  Musculoskeletal: Normal range of motion.  Neurological: He is alert and oriented to person, place, and time.  Skin: Skin is warm and dry.  Psychiatric: He has a normal mood and affect.  Nursing note and vitals reviewed.      UC Treatments / Results  Labs (all labs ordered are listed, but only abnormal results are displayed) Labs Reviewed - No data to display  EKG None  Radiology No results found.  Procedures Procedures (including critical care time)  Medications Ordered in UC Medications - No data to display  Initial Impression / Assessment and Plan / UC Course  I have reviewed the triage vital signs and the nursing notes.  Pertinent labs & imaging results that were available during my care of the patient were reviewed by me and considered in my medical decision  making (see chart for details).     Patient appears to have 2 abrasions to the roof of his mouth, no foreign body visualized, no lesions on tongue noted, airway patent, does not have any difficulty breathing or airway compromise discomfort does not involve the throat.  Seems that abrasions to move of mouth may be a source of discomfort.  Discussed with patient that we do not have a way to definitively say yes or no if there is a foreign body in this area, and without known foreign body it is not recommended to attempt removal, also discussed that the mouth heals very quickly and often small foreign bodies work their way out without need for manipulation.  Recommended to patient to continue to monitor her symptoms over the next 24 to 48 hours, offered viscous lidocaine to help with discomfort.  Patient seemed unsatisfactory with this and requested to have another provider opinion, PA Bess Harvest went and examined the patient as well and discharged him with chlorhexidine and advised to take Tylenol and ibuprofen, please see his note as well.  Discussed going to emergency room if developing difficulty breathing, symptoms developing in throat, shortness of breath.  Final Clinical Impressions(s) / UC Diagnoses   Final diagnoses:  Abrasion of oral cavity, initial encounter     Discharge Instructions     You may take 500mg  Tylenol every 6 hours for pain and inflammation. If you develop swelling, difficulty breathing, fever, chest tightness, then please report to the ER.    ED Prescriptions    Medication Sig Dispense Auth. Provider   chlorhexidine (PERIDEX) 0.12 % solution Use as directed 15 mLs in the mouth or throat 2 (two) times daily. 500 mL Jaynee Eagles, PA-C     Controlled Substance Prescriptions [place] Controlled Substance Registry consulted? Not Applicable   Janith Lima, [place] 03/21/18 9628

## 2019-07-23 IMAGING — DX DG CHEST 2V
2 series · 2 of 2 positions shown · non-contrast
Comparison: Two-view chest x-ray [DATE]

CLINICAL DATA: Chronic dyspnea with progression over the last 2
months.

EXAM:
CHEST  2 VIEW

[chest pa]
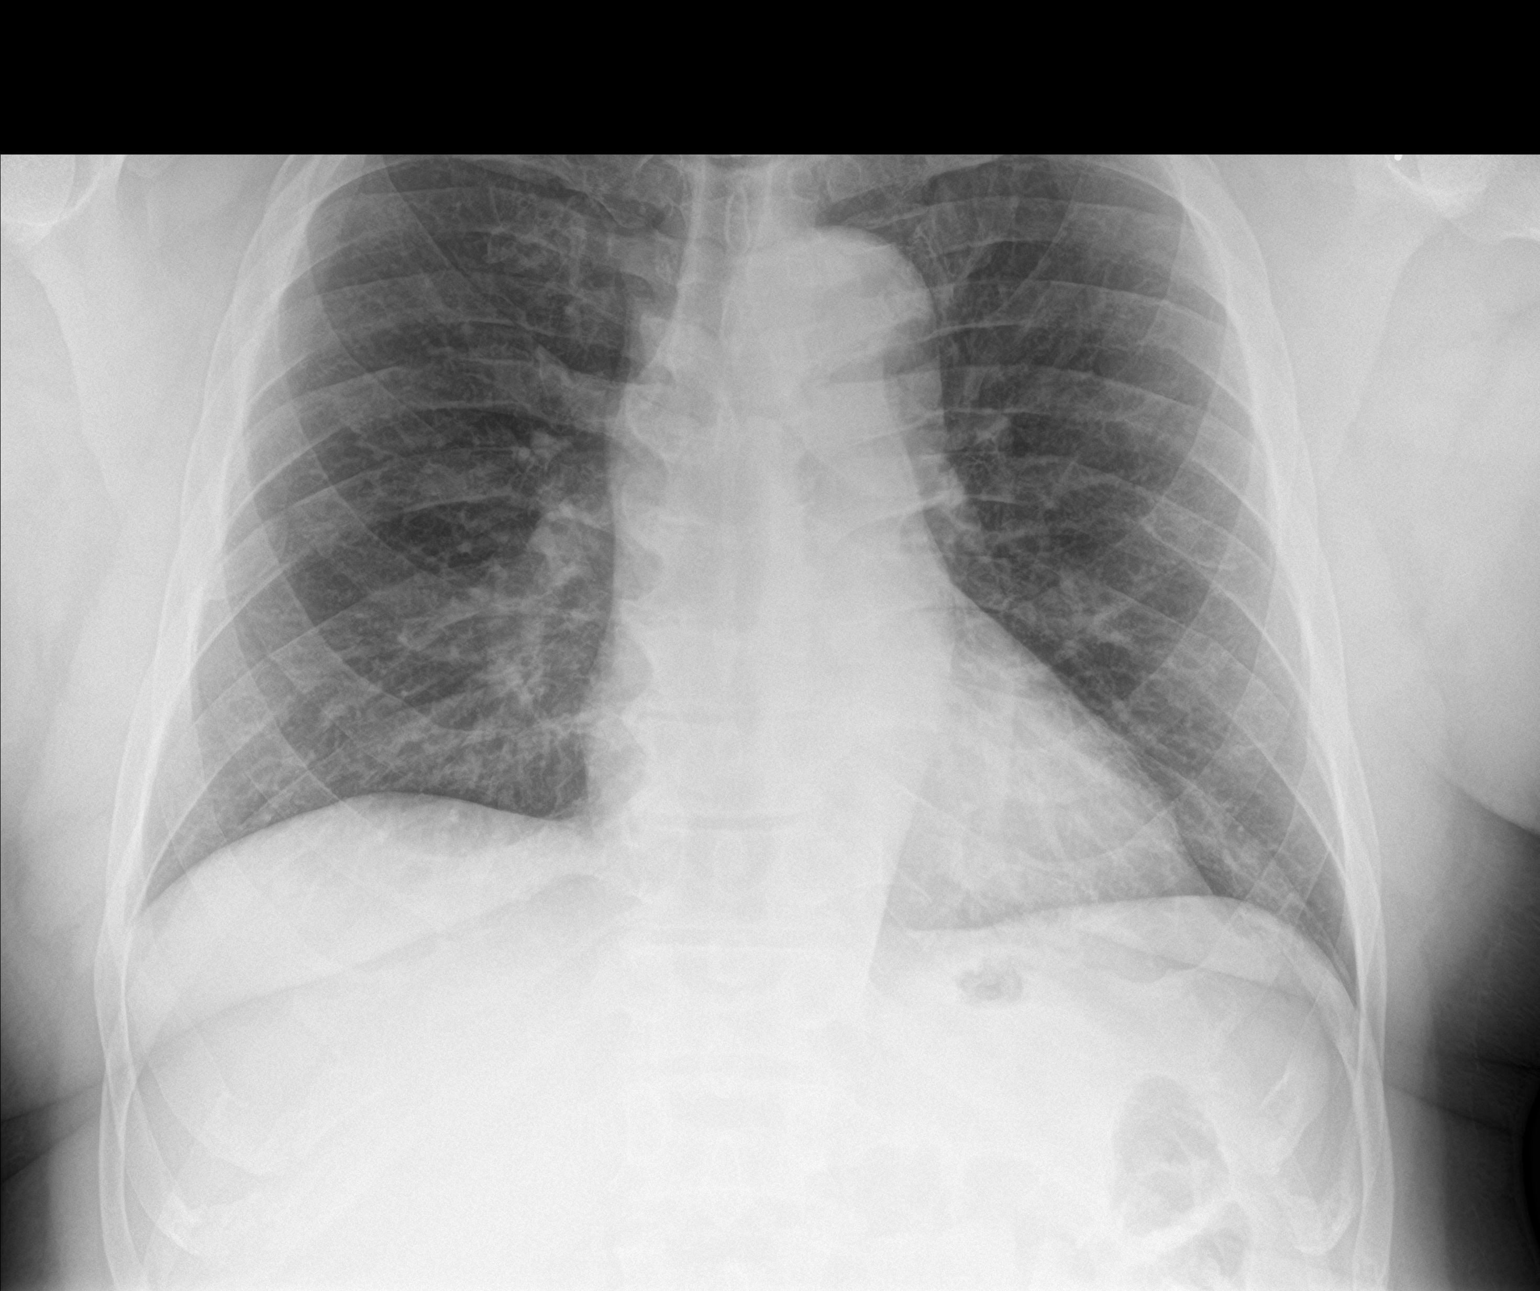

[chest lat]
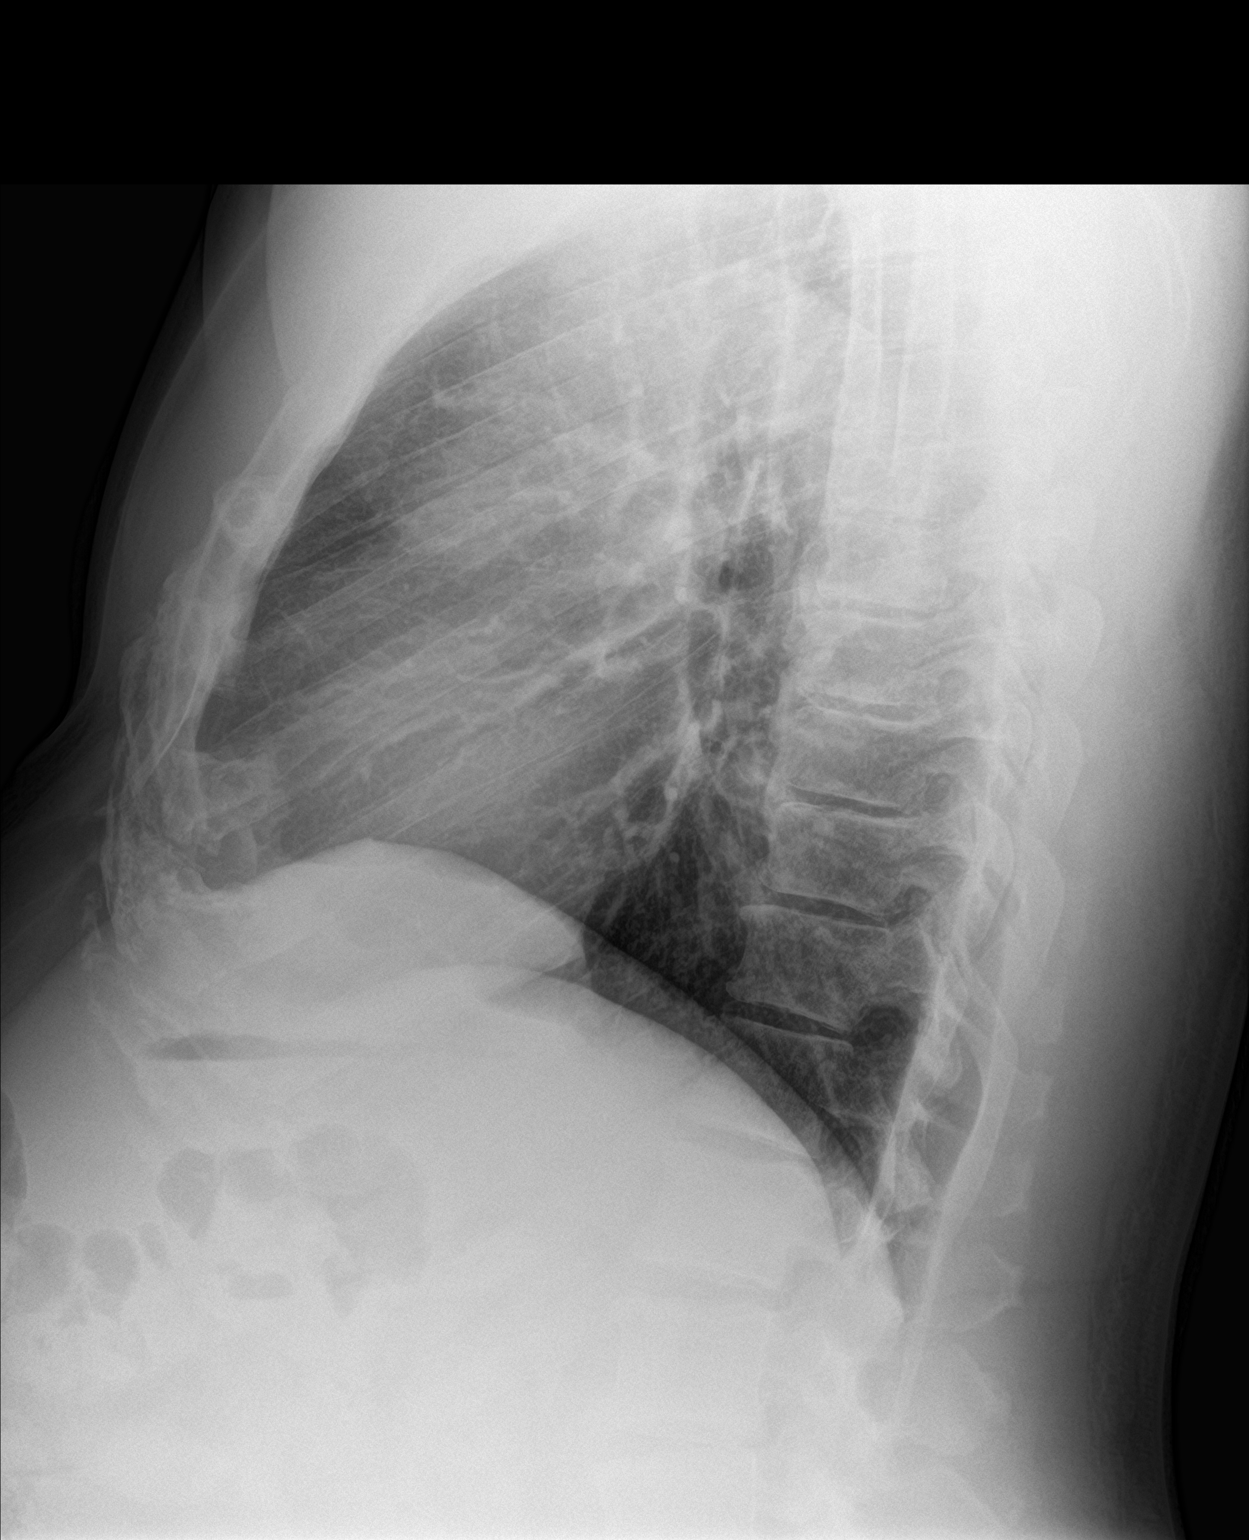

[2 of 2 positions shown; findings below may reference images not displayed]

FINDINGS: The heart size and mediastinal contours are within normal limits.
Both lungs are clear. The visualized skeletal structures are
unremarkable.
IMPRESSION: Negative two view chest x-ray
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
## 2020-02-17 ENCOUNTER — Other Ambulatory Visit: Payer: Self-pay

## 2020-02-17 ENCOUNTER — Encounter (HOSPITAL_COMMUNITY): Payer: Self-pay

## 2020-02-17 ENCOUNTER — Ambulatory Visit (HOSPITAL_COMMUNITY)
Admission: EM | Admit: 2020-02-17 | Discharge: 2020-02-17 | Disposition: A | Discharge status: Home / Self Care | Payer: No Typology Code available for payment source | Attending: Emergency Medicine | Admitting: Emergency Medicine

## 2020-02-17 DIAGNOSIS — Z79899 Other long term (current) drug therapy: Secondary | ICD-10-CM | POA: Insufficient documentation

## 2020-02-17 DIAGNOSIS — U071 COVID-19: Secondary | ICD-10-CM | POA: Insufficient documentation

## 2020-02-17 DIAGNOSIS — Z791 Long term (current) use of non-steroidal anti-inflammatories (NSAID): Secondary | ICD-10-CM | POA: Diagnosis not present

## 2020-02-17 DIAGNOSIS — Z8673 Personal history of transient ischemic attack (TIA), and cerebral infarction without residual deficits: Secondary | ICD-10-CM | POA: Diagnosis not present

## 2020-02-17 DIAGNOSIS — R109 Unspecified abdominal pain: Secondary | ICD-10-CM | POA: Diagnosis not present

## 2020-02-17 DIAGNOSIS — R110 Nausea: Secondary | ICD-10-CM | POA: Diagnosis not present

## 2020-02-17 DIAGNOSIS — R197 Diarrhea, unspecified: Secondary | ICD-10-CM | POA: Insufficient documentation

## 2020-02-17 DIAGNOSIS — R5383 Other fatigue: Secondary | ICD-10-CM | POA: Insufficient documentation

## 2020-02-17 DIAGNOSIS — M62838 Other muscle spasm: Secondary | ICD-10-CM | POA: Insufficient documentation

## 2020-02-17 DIAGNOSIS — J45909 Unspecified asthma, uncomplicated: Secondary | ICD-10-CM | POA: Diagnosis not present

## 2020-02-17 MED ORDER — HYDROXYZINE HCL 25 MG PO TABS: 25.0000 mg | ORAL_TABLET | Freq: Four times a day (QID) | ORAL | 0 refills | Status: DC | PRN

## 2020-02-17 MED ORDER — ONDANSETRON 4 MG PO TBDP: 4.0000 mg | ORAL_TABLET | Freq: Three times a day (TID) | ORAL | 0 refills | Status: DC | PRN

## 2020-02-17 NOTE — Discharge Instructions (Signed)
COVID test pending Zofran for nausea- dissolves in mouth Hydroxyzine before bedtime for nausea/dizziness- may cause drowsiness Rest and fluids Tylenol for fever, bodyaches, chills Follow up if not improving

## 2020-02-17 NOTE — ED Triage Notes (Signed)
Pt presents to UC for nausea/diarrhea x3 days. Pt states he his having 1-2 episodes of diarrhea per day. Pt denies abdominal pain. Pt denies OTC treatments for diarrhea/ nausea.   Pt also complaining of difficulty sleeping for past 3 days, states he is fatigued and dizzy.

## 2020-02-18 LAB — SARS CORONAVIRUS 2 (TAT 6-24 HRS): SARS Coronavirus 2: POSITIVE — AB

## 2020-02-19 ENCOUNTER — Other Ambulatory Visit: Payer: Self-pay | Admitting: Adult Health

## 2020-02-19 DIAGNOSIS — U071 COVID-19: Secondary | ICD-10-CM

## 2020-02-19 NOTE — Progress Notes (Signed)
I connected by phone with Reginald Collier on 02/19/2020 at 7:49 AM to discuss the potential use of a new treatment for mild to moderate COVID-19 viral infection in non-hospitalized patients.  This patient is a 54 y.o. male that meets the FDA criteria for Emergency Use Authorization of COVID monoclonal antibody casirivimab/imdevimab.  Has a (+) direct SARS-CoV-2 viral test result  Has mild or moderate COVID-19   Is NOT hospitalized due to COVID-19  Is within 10 days of symptom onset  Has at least one of the high risk factor(s) for progression to severe COVID-19 and/or hospitalization as defined in EUA.  Specific high risk criteria : Chronic Lung Disease   I have spoken and communicated the following to the patient or parent/caregiver regarding COVID monoclonal antibody treatment:  1. FDA has authorized the emergency use for the treatment of mild to moderate COVID-19 in adults and pediatric patients with positive results of direct SARS-CoV-2 viral testing who are 62 years of age and older weighing at least 40 kg, and who are at high risk for progressing to severe COVID-19 and/or hospitalization.  2. The significant known and potential risks and benefits of COVID monoclonal antibody, and the extent to which such potential risks and benefits are unknown.  3. Information on available alternative treatments and the risks and benefits of those alternatives, including clinical trials.  4. Patients treated with COVID monoclonal antibody should continue to self-isolate and use infection control measures (e.g., wear mask, isolate, social distance, avoid sharing personal items, clean and disinfect "high touch" surfaces, and frequent handwashing) according to CDC guidelines.   5. The patient or parent/caregiver has the option to accept or refuse COVID monoclonal antibody treatment.  After reviewing this information with the patient, The patient agreed to proceed with receiving casirivimab\imdevimab  infusion and will be provided a copy of the Fact sheet prior to receiving the infusion. Scot Dock 02/19/2020 7:49 AM

## 2020-02-19 NOTE — ED Provider Notes (Signed)
[facility]    CSN: [id number] Arrival date & time: 02/17/20  1125      History   Chief Complaint Chief Complaint  Patient presents with   Nausea   Fatigue   Diarrhea    HPI Reginald Collier is a 54 y.o. male presenting today for evaluation of nausea, abdominal pain and diarrhea.  Patient reports approximately 3 days ago he bit began to develop hot and cold chills which have since subsided.  Since he has also had increased nausea, diarrhea and abdominal pain over the past 3 days.  He reports decreased appetite.  He has had associated poor sleep and has had dizziness especially at nighttime.  He is feeling very fatigued and nauseous.  He denies significant URI symptoms.  HPI  Past Medical History:  Diagnosis Date   Allergy    Asthma    Dizziness    TIA (transient ischemic attack)     Patient Active Problem List   Diagnosis Date Noted   Tinnitus 09/22/2017   Paresthesia 02/03/2017   Muscle spasm 02/03/2017   Facial muscle weakness 02/03/2017   Asthma 06/10/2016   Left-sided Bell's palsy 01/16/2016   Benign paroxysmal positional vertigo 06/17/2015   Pituitary adenoma ([place]) 06/17/2015   BMI 38.0-38.9,adult 05/27/2014   OSA (obstructive sleep apnea) 05/27/2014   Osteoarthritis of both knees 05/27/2014   Allergic rhinitis 10/12/2012    Past Surgical History:  Procedure Laterality Date   APPENDECTOMY     HAND TENDON SURGERY     HERNIA REPAIR     KNEE SURGERY     scope on both knees       Home Medications    Prior to Admission medications   Medication Sig Start Date End Date Taking? Authorizing Provider  acetaminophen (TYLENOL) 500 MG tablet Take 2 tablets (1,000 mg total) by mouth every 6 (six) hours as needed. 05/23/16   Gloriann Loan, PA-C  albuterol (PROVENTIL HFA;VENTOLIN HFA) 108 (90 Base) MCG/ACT inhaler Inhale 2 puffs into the lungs every 6 (six) hours as needed for wheezing or shortness of breath. 06/15/17   Mannam, Hart Robinsons,  MD  albuterol (VENTOLIN HFA) 108 (90 Base) MCG/ACT inhaler Inhale 2 puffs into the lungs every 6 (six) hours as needed for wheezing or shortness of breath.    [provider]  amLODipine (NORVASC) 5 MG tablet Take 5 mg by mouth daily.    [provider]  budesonide-formoterol (SYMBICORT) 160-4.5 MCG/ACT inhaler Inhale 2 puffs into the lungs 2 (two) times daily. 04/16/17   Mannam, Hart Robinsons, MD  chlorhexidine (PERIDEX) 0.12 % solution Use as directed 15 mLs in the mouth or throat 2 (two) times daily. 03/20/18   Jaynee Eagles, PA-C  chlorthalidone (HYGROTON) 25 MG tablet Take 25 mg by mouth daily.    [provider]  clopidogrel (PLAVIX) 75 MG tablet Take 75 mg by mouth daily.    [provider]  cyclobenzaprine (FLEXERIL) 10 MG tablet Take 1 tablet (10 mg total) by mouth 2 (two) times daily as needed for muscle spasms. 05/23/16   Gloriann Loan, PA-C  fluticasone furoate-vilanterol (BREO ELLIPTA) 200-25 MCG/INH AEPB Inhale 1 puff into the lungs daily. 04/16/17   Mannam, Hart Robinsons, MD  gabapentin (NEURONTIN) 300 MG capsule Take 1 capsule (300 mg total) by mouth 3 (three) times daily. 06/11/16   Marcial Pacas, MD  GuaiFENesin (HERBAL EXPEC PO) Take 1 each by mouth daily. Herbal tea.    [provider]  hydrOXYzine (ATARAX/VISTARIL) 25 MG tablet Take  1 tablet (25 mg total) by mouth every 6 (six) hours as needed for nausea (dizziness, insomnia). 02/17/20   Bartlomiej Jenkinson C, PA-C  Ipratropium-Albuterol (COMBIVENT RESPIMAT) 20-100 MCG/ACT AERS respimat Inhale 1 puff into the lungs every 6 (six) hours.    [provider]  methocarbamol (ROBAXIN) 500 MG tablet Take 1 tablet (500 mg total) by mouth every 8 (eight) hours as needed for muscle spasms. 09/04/17   Petrucelli, Samantha R, PA-C  naproxen (NAPROSYN) 500 MG tablet Take 1 tablet (500 mg total) by mouth 2 (two) times daily. 05/23/16   Gloriann Loan, PA-C  ondansetron (ZOFRAN ODT) 4 MG disintegrating tablet Take 1 tablet  (4 mg total) by mouth every 8 (eight) hours as needed for nausea or vomiting. 02/17/20   Shina Wass C, PA-C  polyvinyl alcohol (LIQUIFILM TEARS) 1.4 % ophthalmic solution Place 1 drop into both eyes daily as needed for dry eyes.    [provider]    Family History Family History  Problem Relation Age of Onset   Diabetes Mother    Hypertension Mother    Cancer Mother    Alcohol abuse Brother    Cancer Maternal Grandmother    Cancer Maternal Grandfather    Mental illness Sister     Social History Social History   Tobacco Use   Smoking status: Never Smoker   Smokeless tobacco: Never Used  Scientific laboratory technician Use: Never used  Substance Use Topics   Alcohol use: No    Alcohol/week: 0.0 standard drinks   Drug use: No     Allergies   Patient has no known allergies.   Review of Systems Review of Systems  Constitutional: Positive for appetite change, chills and fatigue. Negative for activity change and fever.  HENT: Negative for congestion, ear pain, rhinorrhea, sinus pressure, sore throat and trouble swallowing.   Eyes: Negative for discharge and redness.  Respiratory: Negative for cough, chest tightness and shortness of breath.   Cardiovascular: Negative for chest pain.  Gastrointestinal: Positive for abdominal pain, diarrhea and nausea. Negative for vomiting.  Musculoskeletal: Negative for myalgias.  Skin: Negative for rash.  Neurological: Positive for dizziness. Negative for light-headedness and headaches.     Physical Exam Triage Vital Signs ED Triage Vitals  Enc Vitals Group     BP 02/17/20 1244 140/77     Pulse Rate 02/17/20 1244 98     Resp 02/17/20 1244 16     Temp 02/17/20 1244 99.9 F (37.7 C)     Temp Source 02/17/20 1244 Oral     SpO2 02/17/20 1244 100 %     Weight --      Height --      Head Circumference --      Peak Flow --      Pain Score 02/17/20 1247 0     Pain Loc --      Pain Edu? --      Excl. in [place]? --    No  data found.  Updated Vital Signs BP 140/77 (BP Location: Right Arm)    Pulse 98    Temp 99.9 F (37.7 C) (Oral)    Resp 16    SpO2 100%   Visual Acuity Right Eye Distance:   Left Eye Distance:   Bilateral Distance:    Right Eye Near:   Left Eye Near:    Bilateral Near:     Physical Exam Vitals and nursing note reviewed.  Constitutional:  Appearance: He is well-developed.     Comments: No acute distress  HENT:     Head: Normocephalic and atraumatic.     Ears:     Comments: Bilateral ears without tenderness to palpation of external auricle, tragus and mastoid, EAC's without erythema or swelling, TM's with good bony landmarks and cone of light. Non erythematous.     Nose: Nose normal.     Mouth/Throat:     Comments: Oral mucosa pink and moist, no tonsillar enlargement or exudate. Posterior pharynx patent and nonerythematous, no uvula deviation or swelling. Normal phonation.  Eyes:     Extraocular Movements: Extraocular movements intact.     Conjunctiva/sclera: Conjunctivae normal.     Pupils: Pupils are equal, round, and reactive to light.  Cardiovascular:     Rate and Rhythm: Normal rate.  Pulmonary:     Effort: Pulmonary effort is normal. No respiratory distress.     Comments: Breathing comfortably at rest, CTABL, no wheezing, rales or other adventitious sounds auscultated  Abdominal:     General: There is no distension.     Comments: Soft, nondistended, mild tenderness to palpation throughout entire abdomen, no focal tenderness, negative rebound  Musculoskeletal:        General: Normal range of motion.     Cervical back: Neck supple.  Skin:    General: Skin is warm and dry.  Neurological:     Mental Status: He is alert and oriented to person, place, and time.      UC Treatments / Results  Labs (all labs ordered are listed, but only abnormal results are displayed) Labs Reviewed  SARS CORONAVIRUS 2 (TAT 6-24 HRS) - Abnormal; Notable for the following  components:      Result Value   SARS Coronavirus 2 POSITIVE (*)    All other components within normal limits    EKG   Radiology No results found.  Procedures Procedures (including critical care time)  Medications Ordered in UC Medications - No data to display  Initial Impression / Assessment and Plan / UC Course  I have reviewed the triage vital signs and the nursing notes.  Pertinent labs & imaging results that were available during my care of the patient were reviewed by me and considered in my medical decision making (see chart for details).     Covid test pending, suspect likely viral etiology of nausea/diarrhea.  Focus on rehydration, Zofran as needed.  Providing hydroxyzine to use as needed for nausea/dizziness/insomnia.  Rest and fluids.  Continue to monitor symptoms, not suggestive of abdominal emergency at this time.Discussed strict return precautions. Patient verbalized understanding and is agreeable with plan.  Final Clinical Impressions(s) / UC Diagnoses   Final diagnoses:  Nausea without vomiting  Diarrhea, unspecified type     Discharge Instructions     COVID test pending Zofran for nausea- dissolves in mouth Hydroxyzine before bedtime for nausea/dizziness- may cause drowsiness Rest and fluids Tylenol for fever, bodyaches, chills Follow up if not improving   ED Prescriptions    Medication Sig Dispense Auth. Provider   ondansetron (ZOFRAN ODT) 4 MG disintegrating tablet Take 1 tablet (4 mg total) by mouth every 8 (eight) hours as needed for nausea or vomiting. 20 tablet Eulah Walkup C, PA-C   hydrOXYzine (ATARAX/VISTARIL) 25 MG tablet Take 1 tablet (25 mg total) by mouth every 6 (six) hours as needed for nausea (dizziness, insomnia). 16 tablet Jaceion Aday, [place] C, PA-C     PDMP not reviewed this encounter.   Kaveon Blatz,  Wilton Thrall C, PA-C 02/19/20 1828

## 2020-02-20 MED ORDER — SODIUM CHLORIDE 0.9 % IV SOLN
1200.0000 mg | Freq: Once | INTRAVENOUS | Status: AC
  Administered 2020-02-21: 09:00:00 1200 mg via INTRAVENOUS
  Filled 2020-02-20: qty 1200

## 2020-02-21 ENCOUNTER — Ambulatory Visit (HOSPITAL_COMMUNITY)
Admission: RE | Admit: 2020-02-21 | Discharge: 2020-02-21 | Disposition: A | Discharge status: Home / Self Care | Payer: No Typology Code available for payment source | Source: Ambulatory Visit | Attending: Pulmonary Disease | Admitting: Pulmonary Disease

## 2020-02-21 DIAGNOSIS — U071 COVID-19: Secondary | ICD-10-CM | POA: Insufficient documentation

## 2020-02-21 MED ORDER — DIPHENHYDRAMINE HCL 50 MG/ML IJ SOLN: 50.0000 mg | Freq: Once | INTRAMUSCULAR | Status: DC | PRN

## 2020-02-21 MED ORDER — EPINEPHRINE 0.3 MG/0.3ML IJ SOAJ: 0.3000 mg | Freq: Once | INTRAMUSCULAR | Status: DC | PRN

## 2020-02-21 MED ORDER — ALBUTEROL SULFATE HFA 108 (90 BASE) MCG/ACT IN AERS: 2.0000 | INHALATION_SPRAY | Freq: Once | RESPIRATORY_TRACT | Status: DC | PRN

## 2020-02-21 MED ORDER — METHYLPREDNISOLONE SODIUM SUCC 125 MG IJ SOLR: 125.0000 mg | Freq: Once | INTRAMUSCULAR | Status: DC | PRN

## 2020-02-21 MED ORDER — FAMOTIDINE IN NACL 20-0.9 MG/50ML-% IV SOLN: 20.0000 mg | Freq: Once | INTRAVENOUS | Status: DC | PRN

## 2020-02-21 MED ORDER — SODIUM CHLORIDE 0.9 % IV SOLN: INTRAVENOUS | Status: DC | PRN

## 2020-02-21 NOTE — Discharge Instructions (Signed)

## 2020-02-21 NOTE — Progress Notes (Signed)
  Diagnosis: COVID-19  Physician:Dr Wright  Procedure: [facility] Infusion [facility] Med: casirivimab\imdevimab infusion - Provided patient with casirivimab\imdevimab fact sheet for patients, parents and caregivers prior to infusion.  Complications: No immediate complications noted.  Discharge: Discharged home   Reginald Collier 02/21/2020  

## 2020-05-13 ENCOUNTER — Telehealth: Payer: Self-pay

## 2020-05-13 NOTE — Telephone Encounter (Signed)
Patient called and says he was told by his doctor and the nurses at the [facility] infusion clinic to wait 90 days before receiving the vaccine. He asked if he should receive it on the 90th day or after. I advised it should be after. Appointment scheduled for Friday, 05/24/20 at 1415 at [facility].

## 2020-05-24 ENCOUNTER — Ambulatory Visit: Payer: No Typology Code available for payment source

## 2021-09-30 NOTE — Therapy (Signed)
?OUTPATIENT PHYSICAL THERAPY LOWER EXTREMITY EVALUATION ? ? ?Patient Name: Reginald Collier ?MRN: [id number] ?DOB:February 19, 1966, 56 y.o., male ?Today's Date: 10/01/2021 ? ? PT End of Session -  ? ? Visit Number 1   ? Number of Visits 13   ? Date for PT Re-Evaluation 11/13/21   ? Authorization Type [facility] 15 V from 2/17 to 6/17   ? Authorization - Visit Number 1   ? Authorization - Number of Visits 15   ? Progress Note Due on Visit 13   ? PT Start Time 1315   ? PT Stop Time 1400   ? PT Time Calculation (min) 45 min   ? Activity Tolerance Patient tolerated treatment well   ? Behavior During Therapy [facility] for tasks assessed/performed   ? ?  ?  ? ?  ? ? ?Past Medical History:  ?Diagnosis Date  ? Allergy   ? Asthma   ? Dizziness   ? TIA (transient ischemic attack)   ? ?Past Surgical History:  ?Procedure Laterality Date  ? APPENDECTOMY    ? HAND TENDON SURGERY    ? HERNIA REPAIR    ? KNEE SURGERY    ? scope on both knees  ? ?Patient Active Problem List  ? Diagnosis Date Noted  ? Tinnitus 09/22/2017  ? Paresthesia 02/03/2017  ? Muscle spasm 02/03/2017  ? Facial muscle weakness 02/03/2017  ? Asthma 06/10/2016  ? Left-sided Bell's palsy 01/16/2016  ? Benign paroxysmal positional vertigo 06/17/2015  ? Pituitary adenoma ([place]) 06/17/2015  ? BMI 38.0-38.9,adult 05/27/2014  ? OSA (obstructive sleep apnea) 05/27/2014  ? Osteoarthritis of both knees 05/27/2014  ? Allergic rhinitis 10/12/2012  ? ? ?PCP: Windy Fast, MD ? ?REFERRING PROVIDER: Dara Lords, Alexzandrew L,* ? ?REFERRING DIAG: BILATERAL PRIMARY OSTEOARTHRITIS OF THE KNEE  ? ?THERAPY DIAG:  ?Chronic pain of left knee ? ?Chronic pain of right knee ? ?Other abnormalities of gait and mobility ? ?Muscle weakness (generalized) ? ?ONSET DATE: 09/08/2021  ? ?SUBJECTIVE:  ? ?SUBJECTIVE STATEMENT: ?Pt has bad multiple injuries in [employer]. In February, he had lot of pain in left knee where he couldn't walk.  ? ?PERTINENT HISTORY: ?Has had multiple arthroscopic surgeries on both knees; last  surgery was left knee in 2018, has had 2 TIAs, one was in 12/2014 and 07/2015. ? ?PAIN:  ?Are you having pain? Yes: NPRS scale: 10/10 ?Pain location: bil knees L>R ?Pain description: sharp, stabbing ?Aggravating factors: standing, walking, squatting, stairs, even at rest sometimes ?Relieving factors: medication, rest ? ?PRECAUTIONS: None ? ?WEIGHT BEARING RESTRICTIONS No ? ?FALLS:  ?Has patient fallen in last 6 months? No ? ?LIVING ENVIRONMENT: ?Lives with: lives alone ?Lives in: House/apartment ?Stairs: No;  ?Has following equipment at home: Single point cane ? ?OCCUPATION: medically retired ? ?PLOF: Independent ? ?PATIENT GOALS improve strength and flexibility ? ? ?OBJECTIVE:  ? ? ? ?COGNITION: ? Overall cognitive status: Within functional limits for tasks assessed   ?  ? ?PALPATION: ?Tender over medial and lateral joint line bil and posterior L knee. ? ?LE ROM: ? ?Active ROM Right ?10/02/2021 Left ?10/02/2021  ?Hip flexion    ?Hip extension    ?Hip abduction    ?Hip adduction    ?Hip internal rotation    ?Hip external rotation    ?Knee flexion -10 85  ?Knee extension -5 100  ?Ankle dorsiflexion    ?Ankle plantarflexion    ?Ankle inversion    ?Ankle eversion    ? (Blank rows = not tested) ? ?LE MMT: ? ?  MMT Right ?10/02/2021 Left ?10/02/2021  ?Hip flexion 5/5 4/5  ?Hip extension 4/5 3+/5  ?Hip abduction 4/5 3+/5  ?Hip adduction    ?Hip internal rotation    ?Hip external rotation    ?Knee flexion 5/5 4/5  ?Knee extension 5/5 4/5  ?Ankle dorsiflexion    ?Ankle plantarflexion    ?Ankle inversion    ?Ankle eversion    ? (Blank rows = not tested) ? ? ? ?FUNCTIONAL TESTS:  ?30 seconds chair stand test: 7 reps without HHA from low mat table. ?SLS: 4 sec R and L ? ? ? ? ?TODAY'S TREATMENT: ?10/01/21 ?Sit to stand: 10x ?SLR, SL hip abduction, prone knee flexion, prone hip extension: 10 R and L ? ? ?PATIENT EDUCATION:  ?Education details: Instructed in HEP, see above ?Person educated: Patient ?Education method:  Explanation ?Education comprehension: verbalized understanding ? ? ?HOME EXERCISE PROGRAM: ?Access Code [email or web address] ? ?ASSESSMENT: ? ?CLINICAL IMPRESSION: ?Patient is a 56 y.o. male who was seen today for physical therapy evaluation and treatment for bil knee pain due to bil DJD of knees.  ? ? ?OBJECTIVE IMPAIRMENTS Abnormal gait, decreased activity tolerance, decreased balance, decreased endurance, decreased mobility, difficulty walking, decreased ROM, decreased strength, hypomobility, increased fascial restrictions, impaired flexibility, postural dysfunction, and pain.  ? ?ACTIVITY LIMITATIONS cleaning, community activity, driving, laundry, yard work, and shopping.  ? ?PERSONAL FACTORS Past/current experiences and Time since onset of injury/illness/exacerbation are also affecting patient's functional outcome.  ? ? ?REHAB POTENTIAL: Fair Chronicity ? ?CLINICAL DECISION MAKING: Stable/uncomplicated ? ?EVALUATION COMPLEXITY: Low ? ? ?GOALS: ?Goals reviewed with patient? Yes ? ?SHORT TERM GOALS: = Long term goals ? ?LONG TERM GOALS: Target date: 11/13/2021 ? ?Patient will demo 100 deg of flexion AROM in L knee to improve transfers with sit to stand ?Baseline: 100 deg R; 85 deg L (10/01/21) ?Goal status: INITIAL ? ?2.  Pt will demo -5 to 0 deg of knee extension bil to improve knee extendion during stance phase and improve gait endurance. ?Baseline: -5 on R and -10 on L (10/01/21) ?Goal status: INITIAL ? ?3.  Pt will demo 12 reps of better with 30 sec chair stand test without use of UE to improve functional strength ?Baseline: 7 sec (10/01/21) ?Goal status: INITIAL ? ?4.  Pt will be able to stand with single leg stance for at least 10 sec to improve static balance with functional activities  ?Baseline: 4 sec bil (10/01/21) ?Goal status: INITIAL ? ?5.  Pt will report <7/10 pain in bil knees at worst to improve pain with functional activities such as standing, walking, stairs ?Baseline: 10/10 (10/02/21) ?Goal status:  INITIAL ? ? ?PLAN: ?PT FREQUENCY: 2x/week ? ?PT DURATION: 6 weeks ? ?PLANNED INTERVENTIONS: Therapeutic exercises, Therapeutic activity, Neuromuscular re-education, Balance training, Gait training, Patient/Family education, Joint manipulation, Joint mobilization, Stair training, Orthotic/Fit training, Aquatic Therapy, Cryotherapy, Moist heat, and Manual therapy ? ?PLAN FOR NEXT SESSION: Assess gait speed ? ? ?Kerrie Pleasure, PT ?10/02/2021, 9:17 AM ? ?

## 2021-10-01 ENCOUNTER — Other Ambulatory Visit: Payer: Self-pay

## 2021-10-01 ENCOUNTER — Ambulatory Visit: Payer: No Typology Code available for payment source | Attending: Orthopedic Surgery

## 2021-10-01 DIAGNOSIS — M25562 Pain in left knee: Secondary | ICD-10-CM | POA: Insufficient documentation

## 2021-10-01 DIAGNOSIS — M6281 Muscle weakness (generalized): Secondary | ICD-10-CM | POA: Diagnosis present

## 2021-10-01 DIAGNOSIS — R2689 Other abnormalities of gait and mobility: Secondary | ICD-10-CM | POA: Insufficient documentation

## 2021-10-01 DIAGNOSIS — G8929 Other chronic pain: Secondary | ICD-10-CM | POA: Insufficient documentation

## 2021-10-01 DIAGNOSIS — M25561 Pain in right knee: Secondary | ICD-10-CM | POA: Insufficient documentation

## 2021-10-02 NOTE — Therapy (Deleted)
?OUTPATIENT PHYSICAL THERAPY LOWER EXTREMITY EVALUATION ? ? ?Patient Name: Reginald Collier ?MRN: [id number] ?DOB:1965-08-09, 56 y.o., male ?Today's Date: 10/01/2021 ? ? ? ?Past Medical History:  ?Diagnosis Date  ? Allergy   ? Asthma   ? Dizziness   ? TIA (transient ischemic attack)   ? ?Past Surgical History:  ?Procedure Laterality Date  ? APPENDECTOMY    ? HAND TENDON SURGERY    ? HERNIA REPAIR    ? KNEE SURGERY    ? scope on both knees  ? ?Patient Active Problem List  ? Diagnosis Date Noted  ? Tinnitus 09/22/2017  ? Paresthesia 02/03/2017  ? Muscle spasm 02/03/2017  ? Facial muscle weakness 02/03/2017  ? Asthma 06/10/2016  ? Left-sided Bell's palsy 01/16/2016  ? Benign paroxysmal positional vertigo 06/17/2015  ? Pituitary adenoma ([place]) 06/17/2015  ? BMI 38.0-38.9,adult 05/27/2014  ? OSA (obstructive sleep apnea) 05/27/2014  ? Osteoarthritis of both knees 05/27/2014  ? Allergic rhinitis 10/12/2012  ? ? ?PCP: Windy Fast, MD ? ?REFERRING PROVIDER: Dara Lords, Alexzandrew L,* ? ?REFERRING DIAG: BILATERAL PRIMARY OSTEOARTHRITIS OF THE KNEE  ? ?THERAPY DIAG:  ?No diagnosis found. ? ?ONSET DATE: 09/08/2021  ? ?SUBJECTIVE:  ? ?SUBJECTIVE STATEMENT: ?Pt has bad multiple injuries in [employer]. In February, he had lot of pain in left knee where he couldn't walk.  ? ?PERTINENT HISTORY: ?Has had multiple arthroscopic surgeries on both knees; last surgery was left knee in 2018, has had 2 TIAs, one was in 12/2014 and 07/2015. ? ?PAIN:  ?Are you having pain? Yes: NPRS scale: 10/10 ?Pain location: bil knees L>R ?Pain description: sharp, stabbing ?Aggravating factors: standing, walking, squatting, stairs, even at rest sometimes ?Relieving factors: medication, rest ? ?PRECAUTIONS: None ? ?WEIGHT BEARING RESTRICTIONS No ? ?FALLS:  ?Has patient fallen in last 6 months? No ? ?LIVING ENVIRONMENT: ?Lives with: lives alone ?Lives in: House/apartment ?Stairs: No;  ?Has following equipment at home: Single point cane ? ?OCCUPATION: medically  retired ? ?PLOF: Independent ? ?PATIENT GOALS improve strength and flexibility ? ? ?OBJECTIVE:  ? ? ? ?COGNITION: ? Overall cognitive status: Within functional limits for tasks assessed   ?  ? ?PALPATION: ?Tender over medial and lateral joint line bil and posterior L knee. ? ?LE ROM: ? ?Active ROM Right ?10/02/2021 Left ?10/02/2021  ?Hip flexion    ?Hip extension    ?Hip abduction    ?Hip adduction    ?Hip internal rotation    ?Hip external rotation    ?Knee flexion -10 85  ?Knee extension -5 100  ?Ankle dorsiflexion    ?Ankle plantarflexion    ?Ankle inversion    ?Ankle eversion    ? (Blank rows = not tested) ? ?LE MMT: ? ?MMT Right ?10/02/2021 Left ?10/02/2021  ?Hip flexion 5/5 4/5  ?Hip extension 4/5 3+/5  ?Hip abduction 4/5 3+/5  ?Hip adduction    ?Hip internal rotation    ?Hip external rotation    ?Knee flexion 5/5 4/5  ?Knee extension 5/5 4/5  ?Ankle dorsiflexion    ?Ankle plantarflexion    ?Ankle inversion    ?Ankle eversion    ? (Blank rows = not tested) ? ? ? ?FUNCTIONAL TESTS:  ?30 seconds chair stand test: 7 reps without HHA from low mat table. ?SLS: 4 sec R and L ? ? ? ? ?TODAY'S TREATMENT: ?10/01/21 ?Sit to stand: 10x ?SLR, SL hip abduction, prone knee flexion, prone hip extension: 10 R and L ? ? ?PATIENT EDUCATION:  ?Education details: Instructed in HEP, see  above ?Person educated: Patient ?Education method: Explanation ?Education comprehension: verbalized understanding ? ? ?HOME EXERCISE PROGRAM: ?Access Code [email or web address] ? ?ASSESSMENT: ? ?CLINICAL IMPRESSION: ?Patient is a 56 y.o. male who was seen today for physical therapy evaluation and treatment for bil knee pain due to bil DJD of knees.  ? ? ?OBJECTIVE IMPAIRMENTS Abnormal gait, decreased activity tolerance, decreased balance, decreased endurance, decreased mobility, difficulty walking, decreased ROM, decreased strength, hypomobility, increased fascial restrictions, impaired flexibility, postural dysfunction, and pain.  ? ?ACTIVITY LIMITATIONS cleaning,  community activity, driving, laundry, yard work, and shopping.  ? ?PERSONAL FACTORS Past/current experiences and Time since onset of injury/illness/exacerbation are also affecting patient's functional outcome.  ? ? ?REHAB POTENTIAL: Fair Chronicity ? ?CLINICAL DECISION MAKING: Stable/uncomplicated ? ?EVALUATION COMPLEXITY: Low ? ? ?GOALS: ?Goals reviewed with patient? Yes ? ?SHORT TERM GOALS: = Long term goals ? ?LONG TERM GOALS: Target date: 11/13/2021 ? ?Patient will demo 100 deg of flexion AROM in L knee to improve transfers with sit to stand ?Baseline: 100 deg R; 85 deg L (10/01/21) ?Goal status: INITIAL ? ?2.  Pt will demo -5 to 0 deg of knee extension bil to improve knee extendion during stance phase and improve gait endurance. ?Baseline: -5 on R and -10 on L (10/01/21) ?Goal status: INITIAL ? ?3.  Pt will demo 12 reps of better with 30 sec chair stand test without use of UE to improve functional strength ?Baseline: 7 sec (10/01/21) ?Goal status: INITIAL ? ?4.  Pt will be able to stand with single leg stance for at least 10 sec to improve static balance with functional activities  ?Baseline: 4 sec bil (10/01/21) ?Goal status: INITIAL ? ?5.  Pt will report <7/10 pain in bil knees at worst to improve pain with functional activities such as standing, walking, stairs ?Baseline: 10/10 (10/02/21) ?Goal status: INITIAL ? ? ?PLAN: ?PT FREQUENCY: 2x/week ? ?PT DURATION: 6 weeks ? ?PLANNED INTERVENTIONS: Therapeutic exercises, Therapeutic activity, Neuromuscular re-education, Balance training, Gait training, Patient/Family education, Joint manipulation, Joint mobilization, Stair training, Orthotic/Fit training, Aquatic Therapy, Cryotherapy, Moist heat, and Manual therapy ? ?PLAN FOR NEXT SESSION: Assess gait speed ? ? ?Kerrie Pleasure, PT ?10/02/2021, 9:13 AM ? ?

## 2021-10-03 ENCOUNTER — Other Ambulatory Visit: Payer: Self-pay

## 2021-10-03 ENCOUNTER — Ambulatory Visit: Payer: No Typology Code available for payment source

## 2021-10-03 DIAGNOSIS — M25562 Pain in left knee: Secondary | ICD-10-CM | POA: Diagnosis not present

## 2021-10-03 DIAGNOSIS — M6281 Muscle weakness (generalized): Secondary | ICD-10-CM

## 2021-10-03 DIAGNOSIS — G8929 Other chronic pain: Secondary | ICD-10-CM

## 2021-10-03 DIAGNOSIS — R2689 Other abnormalities of gait and mobility: Secondary | ICD-10-CM

## 2021-10-03 NOTE — Therapy (Signed)
?OUTPATIENT PHYSICAL THERAPY TREATMENT NOTE ? ? ?Patient Name: Reginald Collier ?MRN: [id number] ?DOB:[birth year], 56 y.o., male ?Today's Date: 10/03/2021 ? ?PCP: Windy Fast, MD ?REFERRING PROVIDER:  Joelene Millin,* ? ? PT End of Session - 10/03/21 1449   ? ? Visit Number 2   ? Number of Visits 13   ? Date for PT Re-Evaluation 11/13/21   ? Authorization Type [facility] 15 V from 2/17 to 6/17   ? Authorization - Visit Number 1   ? Authorization - Number of Visits 15   ? Progress Note Due on Visit 13   ? PT Start Time 1450   ? PT Stop Time 7824   ? PT Time Calculation (min) 55 min   ? Activity Tolerance Patient tolerated treatment well   ? Behavior During Therapy [facility] for tasks assessed/performed   ? ?  ?  ? ?  ? ? ?Past Medical History:  ?Diagnosis Date  ? Allergy   ? Asthma   ? Dizziness   ? TIA (transient ischemic attack)   ? ?Past Surgical History:  ?Procedure Laterality Date  ? APPENDECTOMY    ? HAND TENDON SURGERY    ? HERNIA REPAIR    ? KNEE SURGERY    ? scope on both knees  ? ?Patient Active Problem List  ? Diagnosis Date Noted  ? Tinnitus 09/22/2017  ? Paresthesia 02/03/2017  ? Muscle spasm 02/03/2017  ? Facial muscle weakness 02/03/2017  ? Asthma 06/10/2016  ? Left-sided Bell's palsy 01/16/2016  ? Benign paroxysmal positional vertigo 06/17/2015  ? Pituitary adenoma ([place]) 06/17/2015  ? BMI 38.0-38.9,adult 05/27/2014  ? OSA (obstructive sleep apnea) 05/27/2014  ? Osteoarthritis of both knees 05/27/2014  ? Allergic rhinitis 10/12/2012  ? ? ?REFERRING DIAG: BILATERAL PRIMARY OSTEOARTHRITIS OF THE KNEE  ? ?THERAPY DIAG:  ?Chronic pain of left knee ? ?Chronic pain of right knee ? ?Other abnormalities of gait and mobility ? ?Muscle weakness (generalized) ? ?PERTINENT HISTORY:  ?Has had multiple arthroscopic surgeries on both knees; last surgery was left knee in 2018, has had 2 TIAs, one was in 12/2014 and 07/2015. ?  ? ?PRECAUTIONS: none ? ?SUBJECTIVE: My knees stiffened up after stretching at home. Currently L knee  hurts in the back. ? ?PAIN:  ?Are you having pain? Yes: NPRS scale: 6/10 ?Pain location: L posterior knee ?Pain description: achy ?Aggravating factors: wlaking, standing ?Relieving factors: resting ? ? ? ? ? ?OBJECTIVE:  ?  ?LE ROM: ?  ?Active ROM Right ?10/02/2021 Left ?10/02/2021  ?Hip flexion      ?Hip extension      ?Hip abduction      ?Hip adduction      ?Hip internal rotation      ?Hip external rotation      ?Knee flexion -10 85  ?Knee extension -5 100  ?Ankle dorsiflexion      ?Ankle plantarflexion      ?Ankle inversion      ?Ankle eversion      ? (Blank rows = not tested) ?  ?LE MMT: ?  ?MMT Right ?10/02/2021 Left ?10/02/2021  ?Hip flexion 5/5 4/5  ?Hip extension 4/5 3+/5  ?Hip abduction 4/5 3+/5  ?Hip adduction      ?Hip internal rotation      ?Hip external rotation      ?Knee flexion 5/5 4/5  ?Knee extension 5/5 4/5  ?Ankle dorsiflexion      ?Ankle plantarflexion      ?Ankle inversion      ?  Ankle eversion      ? (Blank rows = not tested) ?  ?  ?  ?FUNCTIONAL TESTS:  ?Eval:  ?30 seconds chair stand test: 7 reps without HHA from low mat table. ?SLS: 4 sec R and L ?  ? ?  ?  ?  ?TODAY'S TREATMENT: ?10/03/21: ?Sci fit: level 8 for 8' ?Grade iV knee extension mobilization bil knees ?SAQ [place] small round foam roll under knees: 2 x 10 R and L, 5" holds ?Quad set: 1/2 foam roll under knee: 20 x 5" holds R and L ?SLR: 3 lbs 2 x 10 R and L ?Sl hip abduction: 2 x 10, 3lbs R and L ?Prone knee flexion: 3lbs 2 x 10 R and L ?Gait training: 1 x 115' with st. [place] SBA  ?Calf stretching: 3 x 30" with 1/2 foam roll under ball of foot: 3 x 30" bil ? ?10/01/21 ?Sit to stand: 10x ?SLR, SL hip abduction, prone knee flexion, prone hip extension: 10 R and L ? ?  ?  ?PATIENT EDUCATION:  ?Education details: Instructed in HEP, see above ?Person educated: Patient ?Education method: Explanation ?Education comprehension: verbalized understanding ?  ?  ?HOME EXERCISE PROGRAM: ?Access Code [email or web address] ?  ?ASSESSMENT: ?  ?CLINICAL IMPRESSION: ?Pt  tolerated session well. Reported no increase in pain at end of the session. Pt demonstrated improved tknee extension AROM in L knee at end of the session compared to the start of the session with manual therapy and exercises. ?  ?  ?OBJECTIVE IMPAIRMENTS Abnormal gait, decreased activity tolerance, decreased balance, decreased endurance, decreased mobility, difficulty walking, decreased ROM, decreased strength, hypomobility, increased fascial restrictions, impaired flexibility, postural dysfunction, and pain.  ?  ?ACTIVITY LIMITATIONS cleaning, community activity, driving, laundry, yard work, and shopping.  ?  ?PERSONAL FACTORS Past/current experiences and Time since onset of injury/illness/exacerbation are also affecting patient's functional outcome.  ?  ?  ?REHAB POTENTIAL: Fair Chronicity ?  ?CLINICAL DECISION MAKING: Stable/uncomplicated ?  ?EVALUATION COMPLEXITY: Low ?  ?  ?GOALS: ?Goals reviewed with patient? Yes ?  ?SHORT TERM GOALS: = Long term goals ?  ?LONG TERM GOALS: Target date: 11/13/2021 ?  ?Patient will demo 100 deg of flexion AROM in L knee to improve transfers with sit to stand ?Baseline: 100 deg R; 85 deg L (10/01/21) ?Goal status: INITIAL ?  ?2.  Pt will demo -5 to 0 deg of knee extension bil to improve knee extendion during stance phase and improve gait endurance. ?Baseline: -5 on R and -10 on L (10/01/21) ?Goal status: INITIAL ?  ?3.  Pt will demo 12 reps of better with 30 sec chair stand test without use of UE to improve functional strength ?Baseline: 7 sec (10/01/21) ?Goal status: INITIAL ?  ?4.  Pt will be able to stand with single leg stance for at least 10 sec to improve static balance with functional activities  ?Baseline: 4 sec bil (10/01/21) ?Goal status: INITIAL ?  ?5.  Pt will report <7/10 pain in bil knees at worst to improve pain with functional activities such as standing, walking, stairs ?Baseline: 10/10 (10/02/21) ?Goal status: INITIAL ?  ?  ?PLAN: ?PT FREQUENCY: 2x/week ?  ?PT  DURATION: 6 weeks ?  ?PLANNED INTERVENTIONS: Therapeutic exercises, Therapeutic activity, Neuromuscular re-education, Balance training, Gait training, Patient/Family education, Joint manipulation, Joint mobilization, Stair training, Orthotic/Fit training, Aquatic Therapy, Cryotherapy, Moist heat, and Manual therapy ?  ?PLAN FOR NEXT SESSION: Assess gait speed ? ? ? ?Kerrie Pleasure, PT ?  10/03/2021, 3:44 PM ? ?   ?

## 2021-10-13 ENCOUNTER — Encounter: Payer: Self-pay | Admitting: Physical Therapy

## 2021-10-13 ENCOUNTER — Ambulatory Visit: Payer: No Typology Code available for payment source | Attending: Internal Medicine | Admitting: Physical Therapy

## 2021-10-13 DIAGNOSIS — M25561 Pain in right knee: Secondary | ICD-10-CM | POA: Diagnosis present

## 2021-10-13 DIAGNOSIS — M6281 Muscle weakness (generalized): Secondary | ICD-10-CM | POA: Diagnosis present

## 2021-10-13 DIAGNOSIS — R2689 Other abnormalities of gait and mobility: Secondary | ICD-10-CM | POA: Diagnosis present

## 2021-10-13 DIAGNOSIS — M25562 Pain in left knee: Secondary | ICD-10-CM | POA: Diagnosis present

## 2021-10-13 DIAGNOSIS — G8929 Other chronic pain: Secondary | ICD-10-CM | POA: Insufficient documentation

## 2021-10-13 NOTE — Therapy (Signed)
?OUTPATIENT PHYSICAL THERAPY TREATMENT NOTE ? ? ?Patient Name: Reginald Collier ?MRN: [id number] ?DOB:1966/04/22, 56 y.o., male ?Today's Date: 10/13/2021 ? ?PCP: Windy Fast, MD ?REFERRING PROVIDER:  Joelene Millin,* ? ? PT End of Session - 10/13/21 2009   ? ? Visit Number 3   ? Number of Visits 13   ? Date for PT Re-Evaluation 11/13/21   ? Authorization Type [place] 15 V from 2/17 to 6/17   ? Authorization - Visit Number 3   ? Authorization - Number of Visits 15   ? Progress Note Due on Visit --   ? PT Start Time 1400   ? PT Stop Time 6226   ? PT Time Calculation (min) 43 min   ? Equipment Utilized During Treatment Other (comment)   pool noodles, aquatic cuffs, aquatic weights, aquatic step and aquatic pillow  ? Activity Tolerance Patient tolerated treatment well   ? Behavior During Therapy [facility] for tasks assessed/performed   ? ?  ?  ? ?  ? ? ?Past Medical History:  ?Diagnosis Date  ? Allergy   ? Asthma   ? Dizziness   ? TIA (transient ischemic attack)   ? ?Past Surgical History:  ?Procedure Laterality Date  ? APPENDECTOMY    ? HAND TENDON SURGERY    ? HERNIA REPAIR    ? KNEE SURGERY    ? scope on both knees  ? ?Patient Active Problem List  ? Diagnosis Date Noted  ? Tinnitus 09/22/2017  ? Paresthesia 02/03/2017  ? Muscle spasm 02/03/2017  ? Facial muscle weakness 02/03/2017  ? Asthma 06/10/2016  ? Left-sided Bell's palsy 01/16/2016  ? Benign paroxysmal positional vertigo 06/17/2015  ? Pituitary adenoma ([place]) 06/17/2015  ? BMI 38.0-38.9,adult 05/27/2014  ? OSA (obstructive sleep apnea) 05/27/2014  ? Osteoarthritis of both knees 05/27/2014  ? Allergic rhinitis 10/12/2012  ? ? ?REFERRING DIAG: BILATERAL PRIMARY OSTEOARTHRITIS OF THE KNEE  ? ?THERAPY DIAG:  ?Chronic pain of left knee ? ?Chronic pain of right knee ? ?Other abnormalities of gait and mobility ? ?Muscle weakness (generalized) ? ?PERTINENT HISTORY:  ?Has had multiple arthroscopic surgeries on both knees; last surgery was left knee in 2018, has had 2 TIAs, one  was in 12/2014 and 07/2015. ?  ? ?PRECAUTIONS: Hypertension, H/O TIA's ? ?SUBJECTIVE: No new complaints. Continues with bil knee pain. No falls ? ?PAIN:  ?Are you having pain? Yes ?NPRS scale: 5/10 ?Pain location: bil knees ?Pain orientation: Bilateral  ?PAIN TYPE: chronic ?Pain description: constant, sharp, dull, aching, and sore   ?Aggravating factors: increased standing/weight bearing, certain movements, immobility ?Relieving factors: heat, ice, stretching at times, pain meds, rest ? ?  ?  ?TODAY'S TREATMENT: ?10/13/2021 ?Aquatic therapy at [facility] - pool temp 92 degrees ? ? ? Patient seen for aquatic therapy today.  Treatment took place in water 3.5-4.5 feet deep depending upon activity.  Pt entered/exited pool via stairs with bil rails, step to pattern with min guard assist for safety. ?yellow noodle with gait ?at [place] with aquatic wraps at [facility], side,vack and HS curls ?with step- fwd iwth marhcn, lateral with side kik ?at [place] with 2.5 weights- laq, hip abd/add, scissors ?sit<>stands- parallel, then staggered both feet back ?with aquaitc pillow/noodle adross back- bicyxl ? ?  ? ? ? ? ? ?Pt requires buoyancy of water for support for reduced fall risk with gait training and balance exercises with minimal UE support; exercises able to be performed safely in water without the risk of fall compared  to those same exercises performed on land;  viscosity of water needed for resistance for strengthening.  Current of water provides perturbations for challenging static & dynamic standing balance.    ? ?  ?  ?PATIENT EDUCATION:  ?Education details: Instructed in HEP, see above ?Person educated: Patient ?Education method: Explanation ?Education comprehension: verbalized understanding ?  ?  ?HOME EXERCISE PROGRAM: ?Access Code [email or web address] ?  ? ?  ?  ?GOALS: ?Goals reviewed with patient? Yes ?  ?SHORT TERM GOALS: = Long term goals ?  ?LONG TERM GOALS: Target date: 11/13/2021 ?  ?Patient will demo 100 deg of flexion AROM  in L knee to improve transfers with sit to stand ?Baseline: 100 deg R; 85 deg L (10/01/21) ?Goal status: INITIAL ?  ?2.  Pt will demo -5 to 0 deg of knee extension bil to improve knee extendion during stance phase and improve gait endurance. ?Baseline: -5 on R and -10 on L (10/01/21) ?Goal status: INITIAL ?  ?3.  Pt will demo 12 reps of better with 30 sec chair stand test without use of UE to improve functional strength ?Baseline: 7 sec (10/01/21) ?Goal status: INITIAL ?  ?4.  Pt will be able to stand with single leg stance for at least 10 sec to improve static balance with functional activities  ?Baseline: 4 sec bil (10/01/21) ?Goal status: INITIAL ?  ?5.  Pt will report <7/10 pain in bil knees at worst to improve pain with functional activities such as standing, walking, stairs ?Baseline: 10/10 (10/02/21) ?Goal status: INITIAL ?ASSESSMENT: ?  ?CLINICAL IMPRESSION: session focused on gait and strengthening in the aquatic setting with no issues noted or reported. The pt is making progress and should benefit from continued PT to progress toward unmet goals.  ? ?  ?OBJECTIVE IMPAIRMENTS Abnormal gait, decreased activity tolerance, decreased balance, decreased endurance, decreased mobility, difficulty walking, decreased ROM, decreased strength, hypomobility, increased fascial restrictions, impaired flexibility, postural dysfunction, and pain.  ?  ?ACTIVITY LIMITATIONS cleaning, community activity, driving, laundry, yard work, and shopping.  ?  ?PERSONAL FACTORS Past/current experiences and Time since onset of injury/illness/exacerbation are also affecting patient's functional outcome.  ?  ?  ?REHAB POTENTIAL: Fair Chronicity ?  ?CLINICAL DECISION MAKING: Stable/uncomplicated ?  ?EVALUATION COMPLEXITY: Low ?  ?  ?PLAN: ?PT FREQUENCY: 2x/week ?  ?PT DURATION: 6 weeks ?  ?PLANNED INTERVENTIONS: Therapeutic exercises, Therapeutic activity, Neuromuscular re-education, Balance training, Gait training, Patient/Family education,  Joint manipulation, Joint mobilization, Stair training, Orthotic/Fit training, Aquatic Therapy, Cryotherapy, Moist heat, and Manual therapy ?  ?PLAN FOR NEXT SESSION: land visits- check gait speed if not done, continue to work on LE strengthening, stretching and gait. Aquatic- continue to work on gait with decreased UE support, strengthening, balance and flexibility.  ? ? ?Willow Ora, PTA, CLT ?[place] ?[address], [address] ?[place], [place] [address] ?[phone] ?10/13/21, 8:11 PM  ? ?   ?

## 2021-10-15 ENCOUNTER — Ambulatory Visit: Payer: No Typology Code available for payment source

## 2021-10-15 DIAGNOSIS — M6281 Muscle weakness (generalized): Secondary | ICD-10-CM

## 2021-10-15 DIAGNOSIS — G8929 Other chronic pain: Secondary | ICD-10-CM

## 2021-10-15 DIAGNOSIS — M25562 Pain in left knee: Secondary | ICD-10-CM | POA: Diagnosis not present

## 2021-10-15 DIAGNOSIS — R2689 Other abnormalities of gait and mobility: Secondary | ICD-10-CM

## 2021-10-15 NOTE — Therapy (Signed)
?OUTPATIENT PHYSICAL THERAPY TREATMENT NOTE ? ? ?Patient Name: Reginald Collier ?MRN: [id number] ?DOB:[birth year], 56 y.o., male ?Today's Date: 10/15/2021 ? ?PCP: Windy Fast, MD ?REFERRING PROVIDER:  Joelene Millin,* ? ? PT End of Session - 10/15/21 1449   ? ? Visit Number 4   ? Number of Visits 13   ? Date for PT Re-Evaluation 11/13/21   ? Authorization Type [place] 15 V from 2/17 to 6/17   ? Authorization - Visit Number 4   ? Authorization - Number of Visits 15   ? PT Start Time 1400   ? PT Stop Time 6568   ? PT Time Calculation (min) 45 min   ? Equipment Utilized During Treatment Other (comment)   pool noodles, aquatic cuffs, aquatic weights, aquatic step and aquatic pillow  ? Activity Tolerance Patient tolerated treatment well   ? Behavior During Therapy [facility] for tasks assessed/performed   ? ?  ?  ? ?  ? ? ?Past Medical History:  ?Diagnosis Date  ? Allergy   ? Asthma   ? Dizziness   ? TIA (transient ischemic attack)   ? ?Past Surgical History:  ?Procedure Laterality Date  ? APPENDECTOMY    ? HAND TENDON SURGERY    ? HERNIA REPAIR    ? KNEE SURGERY    ? scope on both knees  ? ?Patient Active Problem List  ? Diagnosis Date Noted  ? Tinnitus 09/22/2017  ? Paresthesia 02/03/2017  ? Muscle spasm 02/03/2017  ? Facial muscle weakness 02/03/2017  ? Asthma 06/10/2016  ? Left-sided Bell's palsy 01/16/2016  ? Benign paroxysmal positional vertigo 06/17/2015  ? Pituitary adenoma ([place]) 06/17/2015  ? BMI 38.0-38.9,adult 05/27/2014  ? OSA (obstructive sleep apnea) 05/27/2014  ? Osteoarthritis of both knees 05/27/2014  ? Allergic rhinitis 10/12/2012  ? ? ?REFERRING DIAG: BILATERAL PRIMARY OSTEOARTHRITIS OF THE KNEE  ? ?THERAPY DIAG:  ?Chronic pain of left knee ? ?Chronic pain of right knee ? ?Other abnormalities of gait and mobility ? ?Muscle weakness (generalized) ? ?PERTINENT HISTORY:  ?Has had multiple arthroscopic surgeries on both knees; last surgery was left knee in 2018, has had 2 TIAs, one was in 12/2014 and 07/2015. ?   ? ?PRECAUTIONS: Hypertension, H/O TIA's ? ?SUBJECTIVE: No new complaints. Continues with bil knee pain. No falls ? ?PAIN:  ?Are you having pain? Yes ?NPRS scale: 5/10 ?Pain location: bil knees ?Pain orientation: Bilateral  ?PAIN TYPE: chronic ?Pain description: constant, sharp, dull, aching, and sore   ?Aggravating factors: increased standing/weight bearing, certain movements, immobility ?Relieving factors: heat, ice, stretching at times, pain meds, rest ? ?LE ROM: ?  ?Active ROM Right ?10/02/2021 Left ?10/02/2021 Right ?10/15/21 Left ?10/15/21  ?Hip flexion        ?Hip extension        ?Hip abduction        ?Hip adduction        ?Hip internal rotation        ?Hip external rotation        ?Knee flexion 100 85 108 105  ?Knee extension -5 -10 -5 -10  ?Ankle dorsiflexion        ?Ankle plantarflexion        ?Ankle inversion        ?Ankle eversion        ? (Blank rows = not tested) ?  ?LE MMT: ?  ?MMT Right ?10/02/2021 Left ?10/02/2021  ?Hip flexion 5/5 4/5  ?Hip extension 4/5 3+/5  ?Hip abduction 4/5 3+/5  ?  Hip adduction      ?Hip internal rotation      ?Hip external rotation      ?Knee flexion 5/5 4/5  ?Knee extension 5/5 4/5  ?Ankle dorsiflexion      ?Ankle plantarflexion      ?Ankle inversion      ?Ankle eversion      ? (Blank rows = not tested) ?  ?  ?TODAY'S TREATMENT: ?10/15/21 ?Passive knee flexion and extension ?Passive stretch of hamstrings bil: 3 x 30" ?Quad soft tissue mobilization with foam roller bil ? SAQ: 2 x 10, 0lbs R and L ?Soft tissue massage to posterior L knee ?Prone hamstring curls: green band: 3 x 10 R and L ?Prone quad sets: 2 x 10, 5 sec holds ?Wall slides: 2 x 10  ?Seated lumbar flexion: 3 x 10"  ? ?10/13/2021 ?Aquatic therapy at [facility] - pool temp 92 degrees ? ? ? Patient seen for aquatic therapy today.  Treatment took place in water 3.5-4.5 feet deep depending upon activity.  Pt entered/exited pool via stairs with bil rails, step to pattern with min guard assist for safety. ?yellow noodle with  gait ?at [place] with aquatic wraps at [facility], side,vack and HS curls ?with step- fwd iwth marhcn, lateral with side kik ?at bendx with 2.5 weights- laq, hip abd/add, scissors ?sit<>stands- parallel, then staggered both feet back ?with aquaitc pillow/noodle adross back- bicyxl ? ?  ? ? ? ? ? ?Pt requires buoyancy of water for support for reduced fall risk with gait training and balance exercises with minimal UE support; exercises able to be performed safely in water without the risk of fall compared to those same exercises performed on land;  viscosity of water needed for resistance for strengthening.  Current of water provides perturbations for challenging static & dynamic standing balance.    ? ?  ?  ?PATIENT EDUCATION:  ?Education details: Instructed in HEP, see above ?Person educated: Patient ?Education method: Explanation ?Education comprehension: verbalized understanding ?  ?  ?HOME EXERCISE PROGRAM: ?Access Code [email or web address] ?  ? ?  ?  ?GOALS: ?Goals reviewed with patient? Yes ?  ?SHORT TERM GOALS: = Long term goals ?  ?LONG TERM GOALS: Target date: 11/13/2021 ?  ?Patient will demo 100 deg of flexion AROM in L knee to improve transfers with sit to stand ?Baseline: 100 deg R; 85 deg L (10/01/21); 105 deg L; 108 deg R ?Goal status: Igoal met 10/15/21 ?  ?2.  Pt will demo -5 to 0 deg of knee extension bil to improve knee extendion during stance phase and improve gait endurance. ?Baseline: -5 on R and -10 on L (10/01/21); unchanged 10/15/21 ?Goal status: Progressing ?  ?3.  Pt will demo 12 reps or better with 30 sec chair stand test without use of UE to improve functional strength ?Baseline: 7 sec (10/01/21) ?Goal status: INITIAL ?  ?4.  Pt will be able to stand with single leg stance for at least 10 sec to improve static balance with functional activities  ?Baseline: 4 sec bil (10/01/21) ?Goal status: INITIAL ?  ?5.  Pt will report <7/10 pain in bil knees at worst to improve pain with functional activities such as standing,  walking, stairs ?Baseline: 10/10 (10/02/21) ?Goal status: INITIAL ?ASSESSMENT: ?  ?CLINICAL IMPRESSION: Pt demonstrated significant improvement in his knee flexion ROM bil. No change in bil extension ROM bil yet. Pt met LTG#1. ?  ?OBJECTIVE IMPAIRMENTS Abnormal gait, decreased activity tolerance, decreased balance, decreased endurance, decreased  mobility, difficulty walking, decreased ROM, decreased strength, hypomobility, increased fascial restrictions, impaired flexibility, postural dysfunction, and pain.  ?  ?ACTIVITY LIMITATIONS cleaning, community activity, driving, laundry, yard work, and shopping.  ?  ?PERSONAL FACTORS Past/current experiences and Time since onset of injury/illness/exacerbation are also affecting patient's functional outcome.  ?  ?  ?REHAB POTENTIAL: Fair Chronicity ?  ?CLINICAL DECISION MAKING: Stable/uncomplicated ?  ?EVALUATION COMPLEXITY: Low ?  ?  ?PLAN: ?PT FREQUENCY: 2x/week ?  ?PT DURATION: 6 weeks ?  ?PLANNED INTERVENTIONS: Therapeutic exercises, Therapeutic activity, Neuromuscular re-education, Balance training, Gait training, Patient/Family education, Joint manipulation, Joint mobilization, Stair training, Orthotic/Fit training, Aquatic Therapy, Cryotherapy, Moist heat, and Manual therapy ?  ?PLAN FOR NEXT SESSION: land visits- check gait speed if not done, continue to work on LE strengthening, stretching and gait. Aquatic- continue to work on gait with decreased UE support, strengthening, balance and flexibility.  ? ? ? ?Kerrie Pleasure, PT ?10/15/2021, 3:33 PM ? ? ?   ?

## 2021-10-27 ENCOUNTER — Ambulatory Visit: Payer: No Typology Code available for payment source | Admitting: Physical Therapy

## 2021-10-29 ENCOUNTER — Ambulatory Visit: Payer: No Typology Code available for payment source

## 2021-10-31 ENCOUNTER — Ambulatory Visit: Payer: No Typology Code available for payment source

## 2021-11-03 ENCOUNTER — Ambulatory Visit: Payer: No Typology Code available for payment source | Admitting: Physical Therapy

## 2021-11-05 ENCOUNTER — Ambulatory Visit: Payer: No Typology Code available for payment source

## 2021-11-05 DIAGNOSIS — M25562 Pain in left knee: Secondary | ICD-10-CM | POA: Diagnosis not present

## 2021-11-05 DIAGNOSIS — G8929 Other chronic pain: Secondary | ICD-10-CM

## 2021-11-05 DIAGNOSIS — R2689 Other abnormalities of gait and mobility: Secondary | ICD-10-CM

## 2021-11-05 DIAGNOSIS — M6281 Muscle weakness (generalized): Secondary | ICD-10-CM

## 2021-11-05 NOTE — Therapy (Signed)
?OUTPATIENT PHYSICAL THERAPY Progress Note ? ?Patient Name: Reginald Collier ?MRN: [id number] ?DOB:1965-07-21, 56 y.o., male ?Today's Date: 11/05/2021 ? ?PCP: Windy Fast, MD ?REFERRING PROVIDER:  Joelene Millin,* ? ? PT End of Session - 11/05/21 1240   ? ? Visit Number 5   ? Number of Visits 15   ? Date for PT Re-Evaluation 12/26/21   ? Authorization Type [place] 15 V from 2/17 to 6/17   ? Authorization - Visit Number 5   ? Authorization - Number of Visits 15   ? Progress Note Due on Visit 15   ? PT Start Time 1230   ? PT Stop Time 1315   ? PT Time Calculation (min) 45 min   ? Equipment Utilized During Treatment Other (comment)   pool noodles, aquatic cuffs, aquatic weights, aquatic step and aquatic pillow  ? Activity Tolerance Patient tolerated treatment well   ? Behavior During Therapy [facility] for tasks assessed/performed   ? ?  ?  ? ?  ? ? ?Past Medical History:  ?Diagnosis Date  ? Allergy   ? Asthma   ? Dizziness   ? TIA (transient ischemic attack)   ? ?Past Surgical History:  ?Procedure Laterality Date  ? APPENDECTOMY    ? HAND TENDON SURGERY    ? HERNIA REPAIR    ? KNEE SURGERY    ? scope on both knees  ? ?Patient Active Problem List  ? Diagnosis Date Noted  ? Tinnitus 09/22/2017  ? Paresthesia 02/03/2017  ? Muscle spasm 02/03/2017  ? Facial muscle weakness 02/03/2017  ? Asthma 06/10/2016  ? Left-sided Bell's palsy 01/16/2016  ? Benign paroxysmal positional vertigo 06/17/2015  ? Pituitary adenoma ([place]) 06/17/2015  ? BMI 38.0-38.9,adult 05/27/2014  ? OSA (obstructive sleep apnea) 05/27/2014  ? Osteoarthritis of both knees 05/27/2014  ? Allergic rhinitis 10/12/2012  ? ? ?REFERRING DIAG: BILATERAL PRIMARY OSTEOARTHRITIS OF THE KNEE  ? ?THERAPY DIAG:  ?Chronic pain of left knee ? ?Chronic pain of right knee ? ?Other abnormalities of gait and mobility ? ?Muscle weakness (generalized) ? ?PERTINENT HISTORY:  ?Has had multiple arthroscopic surgeries on both knees; last surgery was left knee in 2018, has had 2 TIAs, one  was in 12/2014 and 07/2015. ?  ? ?PRECAUTIONS: Hypertension, H/O TIA's ? ?SUBJECTIVE: No new complaints. Continues with bil knee pain. No falls ? ?PAIN:  ?Are you having pain? Yes ?NPRS scale: 5/10 ?Pain location: bil knees ?Pain orientation: Bilateral  ?PAIN TYPE: chronic ?Pain description: constant, sharp, dull, aching, and sore   ?Aggravating factors: increased standing/weight bearing, certain movements, immobility ?Relieving factors: heat, ice, stretching at times, pain meds, rest ? ?LE ROM: ?  ?Active ROM Right ?10/02/2021 Left ?10/02/2021 Right ?10/15/21 Left ?10/15/21 Right ?11/05/21 Left ?11/05/21  ?Hip flexion          ?Hip extension          ?Hip abduction          ?Hip adduction          ?Hip internal rotation          ?Hip external rotation          ?Knee flexion 100 85 108 105 110 105  ?Knee extension -5 -10 -5 -10 -5 -15  ?Ankle dorsiflexion          ?Ankle plantarflexion          ?Ankle inversion          ?Ankle eversion          ? (  Blank rows = not tested) ?  ?LE MMT: ?  ?MMT Right ?10/02/2021 Left ?10/02/2021 Left ?11/05/21  ?Hip flexion 5/5 4/5   ?Hip extension 4/5 3+/5   ?Hip abduction 4/5 3+/5   ?Hip adduction       ?Hip internal rotation       ?Hip external rotation       ?Knee flexion 5/5 4/5   ?Knee extension 5/5 4/5   ?Ankle dorsiflexion       ?Ankle plantarflexion       ?Ankle inversion       ?Ankle eversion       ? (Blank rows = not tested) ? ?5 x sit to stand: 12 sec (11/05/21) ?30 sec sit to stand: 12 reps no HHA (08/07/21)  ?  ?TODAY'S TREATMENT: ?11/05/21: ?ROM and MMT assessed ?Grade IV L LE patella cephalic and caudal mobilization ?Soft tissue mobilizaiton to posterior L knee and proximal peroneals and proximal/lateral portion of gastroc ?PROM of L knee into end range flexion: 10x ?Heel slides: AAROM: 10x with strap- advised pt to perform this at home L only ?SAQ: 10x 5" holds R and L ?Quadruped tall kneeling to heel sitting: 5 x 20" (placed a bench in front of him) ? ?10/15/21 ?Passive knee flexion  and extension ?Passive stretch of hamstrings bil: 3 x 30" ?Quad soft tissue mobilization with foam roller bil ? SAQ: 2 x 10, 0lbs R and L ?Soft tissue massage to posterior L knee ?Prone hamstring curls: green band: 3 x 10 R and L ?Prone quad sets: 2 x 10, 5 sec holds ?Wall slides: 2 x 10  ?Seated lumbar flexion: 3 x 10"  ? ?10/13/2021 ?Aquatic therapy at [facility] - pool temp 92 degrees ? ? ? Patient seen for aquatic therapy today.  Treatment took place in water 3.5-4.5 feet deep depending upon activity.  Pt entered/exited pool via stairs with bil rails, step to pattern with min guard assist for safety. ?yellow noodle with gait ?at [place] with aquatic wraps at [facility], side,vack and HS curls ?with step- fwd iwth marhcn, lateral with side kik ?at bendx with 2.5 weights- laq, hip abd/add, scissors ?sit<>stands- parallel, then staggered both feet back ?with aquaitc pillow/noodle adross back- bicyxl ? ?Pt requires buoyancy of water for support for reduced fall risk with gait training and balance exercises with minimal UE support; exercises able to be performed safely in water without the risk of fall compared to those same exercises performed on land;  viscosity of water needed for resistance for strengthening.  Current of water provides perturbations for challenging static & dynamic standing balance.    ? ?  ?  ?PATIENT EDUCATION:  ?Education details: Instructed in HEP, see above ?Person educated: Patient ?Education method: Explanation ?Education comprehension: verbalized understanding ?  ?  ?HOME EXERCISE PROGRAM: ?Access Code [email or web address] ?  ? ?  ?  ?GOALS: ?Goals reviewed with patient? Yes ?  ?SHORT TERM GOALS: = Long term goals ?  ?LONG TERM GOALS: Target date: 11/13/2021 ?  ?Patient will demo 100 deg of flexion AROM in L knee to improve transfers with sit to stand ?Baseline: 100 deg R; 85 deg L (10/01/21); 105 deg L; 108 deg R ?Goal status: Igoal met 10/15/21 ?  ?2.  Pt will demo -5 to 0 deg of knee extension bil to  improve knee extendion during stance phase and improve gait endurance. ?Baseline: -5 on R and -10 on L (10/01/21); unchanged 10/15/21; unchanged 11/05/21 ?Goal status: Progressing continue ?  ?  3.  Pt will demo 12 reps or better with 30 sec chair stand test without use of UE to improve functional strength. Goal revised 11/05/21: pt will be able to perform >14 reps in 30 sec ?Baseline: 7 sec (10/01/21); 12 reps (11/05/21) ?Goal status: Goal met for 12 sec, progressing towards new goal. ?  ?4.  Pt will be able to stand with single leg stance for at least 10 sec to improve static balance with functional activities  ?Baseline: 4 sec bil (10/01/21). 30 sec on R LE; 19 sec on L LE (11/05/21) ?Goal status: goal met 11/05/21 ?  ?5.  Pt will report <7/10 pain in bil knees at worst to improve pain with functional activities such as standing, walking, stairs ?Baseline: 10/10 (10/02/21); 9-10/10 (11/05/21) ?Goal status: progressing conitnue ?ASSESSMENT: ?  ?CLINICAL IMPRESSION: Pt has been seen for total of 5 sessions from 10/02/21 to 11/05/21. Pt is demonstrating improving knee ROM bilaterally. Patient is reporting increasing tolerance with functional activities despite having pain. Pt will continue to benefit from skilled PT to work on improving long term functional gaols.  ?  ?OBJECTIVE IMPAIRMENTS Abnormal gait, decreased activity tolerance, decreased balance, decreased endurance, decreased mobility, difficulty walking, decreased ROM, decreased strength, hypomobility, increased fascial restrictions, impaired flexibility, postural dysfunction, and pain.  ?  ?ACTIVITY LIMITATIONS cleaning, community activity, driving, laundry, yard work, and shopping.  ?  ?PERSONAL FACTORS Past/current experiences and Time since onset of injury/illness/exacerbation are also affecting patient's functional outcome.  ?  ?  ?REHAB POTENTIAL: Fair Chronicity ?  ?CLINICAL DECISION MAKING: Stable/uncomplicated ?  ?EVALUATION COMPLEXITY: Low ?  ?  ?PLAN: ?PT  FREQUENCY: 1-2x/week ?  ?PT DURATION: 10 sessions (by 12/26/21) ?  ?PLANNED INTERVENTIONS: Therapeutic exercises, Therapeutic activity, Neuromuscular re-education, Balance training, Gait training, Patient/Family ed

## 2021-11-10 ENCOUNTER — Ambulatory Visit: Payer: No Typology Code available for payment source | Attending: Internal Medicine | Admitting: Physical Therapy

## 2021-11-10 DIAGNOSIS — G8929 Other chronic pain: Secondary | ICD-10-CM | POA: Insufficient documentation

## 2021-11-10 DIAGNOSIS — M6281 Muscle weakness (generalized): Secondary | ICD-10-CM | POA: Diagnosis present

## 2021-11-10 DIAGNOSIS — R2689 Other abnormalities of gait and mobility: Secondary | ICD-10-CM | POA: Diagnosis present

## 2021-11-10 DIAGNOSIS — M25562 Pain in left knee: Secondary | ICD-10-CM | POA: Insufficient documentation

## 2021-11-10 DIAGNOSIS — M25561 Pain in right knee: Secondary | ICD-10-CM | POA: Diagnosis present

## 2021-11-10 NOTE — Therapy (Signed)
?OUTPATIENT PHYSICAL THERAPY TREATMENT NOTE ? ?Patient Name: Reginald Collier ?MRN: [id number] ?DOB:May 29, 1966, 56 y.o., male ?Today's Date: 11/10/2021 ? ?PCP: Windy Fast, MD ?REFERRING PROVIDER:  Joelene Millin,* ? ? PT End of Session - 11/10/21 1335   ? ? Visit Number 6   ? Number of Visits 15   ? Date for PT Re-Evaluation 12/26/21   ? Authorization Type [place] 15 V from 2/17 to 6/17   ? Authorization - Visit Number 6   ? Authorization - Number of Visits 15   ? Progress Note Due on Visit 15   ? PT Start Time 1245   pt running late for session  ? PT Stop Time 1315   ? PT Time Calculation (min) 30 min   ? Equipment Utilized During Treatment Other (comment)   single bar bells, aquatic cuffs  ? Activity Tolerance Patient tolerated treatment well   ? Behavior During Therapy [facility] for tasks assessed/performed   ? ?  ?  ? ?  ? ? ?Past Medical History:  ?Diagnosis Date  ? Allergy   ? Asthma   ? Dizziness   ? TIA (transient ischemic attack)   ? ?Past Surgical History:  ?Procedure Laterality Date  ? APPENDECTOMY    ? HAND TENDON SURGERY    ? HERNIA REPAIR    ? KNEE SURGERY    ? scope on both knees  ? ?Patient Active Problem List  ? Diagnosis Date Noted  ? Tinnitus 09/22/2017  ? Paresthesia 02/03/2017  ? Muscle spasm 02/03/2017  ? Facial muscle weakness 02/03/2017  ? Asthma 06/10/2016  ? Left-sided Bell's palsy 01/16/2016  ? Benign paroxysmal positional vertigo 06/17/2015  ? Pituitary adenoma ([place]) 06/17/2015  ? BMI 38.0-38.9,adult 05/27/2014  ? OSA (obstructive sleep apnea) 05/27/2014  ? Osteoarthritis of both knees 05/27/2014  ? Allergic rhinitis 10/12/2012  ? ? ?REFERRING DIAG: BILATERAL PRIMARY OSTEOARTHRITIS OF THE KNEE  ? ?THERAPY DIAG:  ?Chronic pain of left knee ? ?Chronic pain of right knee ? ?Other abnormalities of gait and mobility ? ?Muscle weakness (generalized) ? ?PERTINENT HISTORY:  ?Has had multiple arthroscopic surgeries on both knees; last surgery was left knee in 2018, has had 2 TIAs, one was in 12/2014 and  07/2015. ?  ? ?PRECAUTIONS: Hypertension, H/O TIA's ? ?SUBJECTIVE: No new complaints. Continues with bil knee pain. No falls. ? ?PAIN:  ?Are you having pain? Yes ?NPRS scale: 7/10 ?Pain location: bil knees ?Pain orientation: Bilateral  ?PAIN TYPE: chronic ?Pain description: constant, sharp, dull, aching, and sore   ?Aggravating factors: increased standing/weight bearing, certain movements, immobility ?Relieving factors: heat, ice, stretching at times, pain meds, rest ? ?  ?TODAY'S TREATMENT: ?11/10/2021 ?Aquatic therapy at [facility] - pool temp 92 degrees ? ?Patient seen for aquatic therapy today.  Treatment took place in water 3.5-4.5 feet deep depending upon activity.  Pt entered/exited pool via stairs with bil rails, step to pattern with min guard assist for safety. ? ?Gait from step down to 4.8 foot water depth ?Forward with emphasis on posture and step length for 18 feet across pool for 6 laps ?Backward with emphasis on posture and step length for 18 feet across pool for 6 laps ?Side stepping with emphasis on posture/pelvic position for 18 feet left<>right x 4 laps each way ? ?With single bar bells in ~4.8 water depth ?Forward walking with alternating punching under water level for 18 feet across pool x 6 laps ?Walking high knee marching with contralateral hand<>knee taps each time for 18  feet across pool x 6 laps ?Side stepping with bar bells under water- moving arms out<>back in with stepping left<>right for 18 feet x 3 laps toward each side ? ?At wall in ~4.8-4.5 water depth with bil aquatic cuffs at ankles. Cues on posture and ex form/technique. ?Heel<>toe raises x 15 reps ?Alternating hip abd/add x 15 reps ?Alternating hip ext x 15 reps ?Alternating hamstring curls x 15 reps ?Alternating slow, high knee marching x 15 reps ?Alternating forward mini lunges x 15 reps ? ? ?Pt requires buoyancy of water for support for reduced fall risk with gait training and balance exercises with minimal UE support; exercises  able to be performed safely in water without the risk of fall compared to those same exercises performed on land;  viscosity of water needed for resistance for strengthening.  Current of water provides perturbations for challenging static & dynamic standing balance.    ? ?  ?  ?PATIENT EDUCATION:  ?Education details: Instructed in HEP, see above ?Person educated: Patient ?Education method: Explanation ?Education comprehension: verbalized understanding ?  ?  ?HOME EXERCISE PROGRAM: ?Access Code [email or web address] ?  ? ?  ?  ?GOALS: ?Goals reviewed with patient? Yes ?  ?SHORT TERM GOALS: = Long term goals ?  ?LONG TERM GOALS: Target date: 11/13/2021 ?  ?Patient will demo 100 deg of flexion AROM in L knee to improve transfers with sit to stand ?Baseline: 100 deg R; 85 deg L (10/01/21); 105 deg L; 108 deg R ?Goal status: Igoal met 10/15/21 ?  ?2.  Pt will demo -5 to 0 deg of knee extension bil to improve knee extendion during stance phase and improve gait endurance. ?Baseline: -5 on R and -10 on L (10/01/21); unchanged 10/15/21; unchanged 11/05/21 ?Goal status: Progressing continue ?  ?3.  Pt will demo 12 reps or better with 30 sec chair stand test without use of UE to improve functional strength. Goal revised 11/05/21: pt will be able to perform >14 reps in 30 sec ?Baseline: 7 sec (10/01/21); 12 reps (11/05/21) ?Goal status: Goal met for 12 sec, progressing towards new goal. ?  ?4.  Pt will be able to stand with single leg stance for at least 10 sec to improve static balance with functional activities  ?Baseline: 4 sec bil (10/01/21). 30 sec on R LE; 19 sec on L LE (11/05/21) ?Goal status: goal met 11/05/21 ?  ?5.  Pt will report <7/10 pain in bil knees at worst to improve pain with functional activities such as standing, walking, stairs ?Baseline: 10/10 (10/02/21); 9-10/10 (11/05/21) ?Goal status: progressing conitnue ?ASSESSMENT: ?  ?CLINICAL IMPRESSION:  ?Today's skilled session continued to focus on gait and strengthening in the aquatic  setting with no issues noted or reported. The pt is making progress and should benefit from continued PT to progress toward unmet goals. ?  ?OBJECTIVE IMPAIRMENTS Abnormal gait, decreased activity tolerance, decreased balance, decreased endurance, decreased mobility, difficulty walking, decreased ROM, decreased strength, hypomobility, increased fascial restrictions, impaired flexibility, postural dysfunction, and pain.  ?  ?ACTIVITY LIMITATIONS cleaning, community activity, driving, laundry, yard work, and shopping.  ?  ?PERSONAL FACTORS Past/current experiences and Time since onset of injury/illness/exacerbation are also affecting patient's functional outcome.  ?  ?  ?REHAB POTENTIAL: Fair Chronicity ?  ?CLINICAL DECISION MAKING: Stable/uncomplicated ?  ?EVALUATION COMPLEXITY: Low ?  ?  ?PLAN: ?PT FREQUENCY: 1-2x/week ?  ?PT DURATION: 10 sessions (by 12/26/21) ?  ?PLANNED INTERVENTIONS: Therapeutic exercises, Therapeutic activity, Neuromuscular re-education, Balance training, Gait training, Patient/Family  education, Joint manipulation, Joint mobilization, Stair training, Orthotic/Fit training, Aquatic Therapy, Cryotherapy, Moist heat, and Manual therapy ?  ?PLAN FOR NEXT SESSION: land visits- check gait speed if not done, continue to work on LE strengthening, stretching and gait. Aquatic- continue to work on gait with decreased UE support, strengthening, balance and flexibility.  ? ? ? ?Willow Ora, PTA, CLT ?[place] ?[address] ?[place], [place] [address] ?[phone] ?11/11/21, 10:48 AM  ? ? ?   ?

## 2021-11-12 ENCOUNTER — Ambulatory Visit: Payer: No Typology Code available for payment source

## 2021-11-12 DIAGNOSIS — M25562 Pain in left knee: Secondary | ICD-10-CM | POA: Diagnosis not present

## 2021-11-12 DIAGNOSIS — M6281 Muscle weakness (generalized): Secondary | ICD-10-CM

## 2021-11-12 DIAGNOSIS — R2689 Other abnormalities of gait and mobility: Secondary | ICD-10-CM

## 2021-11-12 DIAGNOSIS — G8929 Other chronic pain: Secondary | ICD-10-CM

## 2021-11-12 NOTE — Therapy (Signed)
?OUTPATIENT PHYSICAL THERAPY TREATMENT NOTE ? ?Patient Name: Reginald Collier ?MRN: [id number] ?DOB:04/30/1966, 56 y.o., male ?Today's Date: 11/12/2021 ? ?PCP: Windy Fast, MD ?REFERRING PROVIDER:  Joelene Millin,* ? ? PT End of Session - 11/12/21 1505   ? ? Visit Number 7   ? Number of Visits 15   ? Date for PT Re-Evaluation 12/26/21   ? Authorization Type [facility] 15 V from 2/17 to 6/17   ? Authorization - Visit Number 7   ? Authorization - Number of Visits 15   ? Progress Note Due on Visit 15   ? PT Start Time 2446   ? PT Stop Time 1530   ? PT Time Calculation (min) 45 min   ? Equipment Utilized During Treatment Other (comment)   single bar bells, aquatic cuffs  ? Activity Tolerance Patient tolerated treatment well   ? Behavior During Therapy [facility] for tasks assessed/performed   ? ?  ?  ? ?  ? ? ?Past Medical History:  ?Diagnosis Date  ? Allergy   ? Asthma   ? Dizziness   ? TIA (transient ischemic attack)   ? ?Past Surgical History:  ?Procedure Laterality Date  ? APPENDECTOMY    ? HAND TENDON SURGERY    ? HERNIA REPAIR    ? KNEE SURGERY    ? scope on both knees  ? ?Patient Active Problem List  ? Diagnosis Date Noted  ? Tinnitus 09/22/2017  ? Paresthesia 02/03/2017  ? Muscle spasm 02/03/2017  ? Facial muscle weakness 02/03/2017  ? Asthma 06/10/2016  ? Left-sided Bell's palsy 01/16/2016  ? Benign paroxysmal positional vertigo 06/17/2015  ? Pituitary adenoma ([place]) 06/17/2015  ? BMI 38.0-38.9,adult 05/27/2014  ? OSA (obstructive sleep apnea) 05/27/2014  ? Osteoarthritis of both knees 05/27/2014  ? Allergic rhinitis 10/12/2012  ? ? ?REFERRING DIAG: BILATERAL PRIMARY OSTEOARTHRITIS OF THE KNEE  ? ?THERAPY DIAG:  ?Chronic pain of left knee ? ?Chronic pain of right knee ? ?Other abnormalities of gait and mobility ? ?Muscle weakness (generalized) ? ?PERTINENT HISTORY:  ?Has had multiple arthroscopic surgeries on both knees; last surgery was left knee in 2018, has had 2 TIAs, one was in 12/2014 and 07/2015. ?  ? ?PRECAUTIONS:  Hypertension, H/O TIA's ? ?SUBJECTIVE: Pt reports aquatic therapy is going well. ?PAIN:  ?Are you having pain? Yes ?NPRS scale: 5/10 ?Pain location: bil knees ?Pain orientation: Bilateral  ?PAIN TYPE: chronic ?Pain description: constant, sharp, dull, aching, and sore   ?Aggravating factors: increased standing/weight bearing, certain movements, immobility ?Relieving factors: heat, ice, stretching at times, pain meds, rest ? ?  ?TODAY'S TREATMENT: ?11/12/21: ?Bil leg press: 90 lbs 2 x 10 ?Uni leg press: 60lbs 2 x 10 R and L ?Prone passive knee flexion stretch: 5 x 30" R and L ?Prone contract relax for knee flexion: 5 x 10" holds ?Prone end range knee flexion hold: 10x 10" holds L only ?Foam roll massage to L hamstrings ?Supine hooklying hip adductor stretch: 5 x 1' bil ?Gait training: 1 x 115' with st. Cane ?Standing deadlift: 10lbs from 8" box on floor: 10x, cues to engage knees and hips ? ? ?11/10/2021 ?Aquatic therapy at [facility] - pool temp 92 degrees ? ?Patient seen for aquatic therapy today.  Treatment took place in water 3.5-4.5 feet deep depending upon activity.  Pt entered/exited pool via stairs with bil rails, step to pattern with min guard assist for safety. ? ?Gait from step down to 4.8 foot water depth ?Forward with emphasis on  posture and step length for 18 feet across pool for 6 laps ?Backward with emphasis on posture and step length for 18 feet across pool for 6 laps ?Side stepping with emphasis on posture/pelvic position for 18 feet left<>right x 4 laps each way ? ?With single bar bells in ~4.8 water depth ?Forward walking with alternating punching under water level for 18 feet across pool x 6 laps ?Walking high knee marching with contralateral hand<>knee taps each time for 18 feet across pool x 6 laps ?Side stepping with bar bells under water- moving arms out<>back in with stepping left<>right for 18 feet x 3 laps toward each side ? ?At wall in ~4.8-4.5 water depth with bil aquatic cuffs at ankles.  Cues on posture and ex form/technique. ?Heel<>toe raises x 15 reps ?Alternating hip abd/add x 15 reps ?Alternating hip ext x 15 reps ?Alternating hamstring curls x 15 reps ?Alternating slow, high knee marching x 15 reps ?Alternating forward mini lunges x 15 reps ? ? ?Pt requires buoyancy of water for support for reduced fall risk with gait training and balance exercises with minimal UE support; exercises able to be performed safely in water without the risk of fall compared to those same exercises performed on land;  viscosity of water needed for resistance for strengthening.  Current of water provides perturbations for challenging static & dynamic standing balance.    ? ?  ?  ?PATIENT EDUCATION:  ?Education details: Instructed in HEP, see above ?Person educated: Patient ?Education method: Explanation ?Education comprehension: verbalized understanding ?  ?  ?HOME EXERCISE PROGRAM: ?Access Code [email or web address] ?  ? ?  ?  ?GOALS: ?Goals reviewed with patient? Yes ?  ?SHORT TERM GOALS: = Long term goals ?  ?LONG TERM GOALS: Target date: 11/13/2021 ?  ?Patient will demo 100 deg of flexion AROM in L knee to improve transfers with sit to stand ?Baseline: 100 deg R; 85 deg L (10/01/21); 105 deg L; 108 deg R ?Goal status: Igoal met 10/15/21 ?  ?2.  Pt will demo -5 to 0 deg of knee extension bil to improve knee extendion during stance phase and improve gait endurance. ?Baseline: -5 on R and -10 on L (10/01/21); unchanged 10/15/21; unchanged 11/05/21 ?Goal status: Progressing continue ?  ?3.  Pt will demo 12 reps or better with 30 sec chair stand test without use of UE to improve functional strength. Goal revised 11/05/21: pt will be able to perform >14 reps in 30 sec ?Baseline: 7 sec (10/01/21); 12 reps (11/05/21) ?Goal status: Goal met for 12 sec, progressing towards new goal. ?  ?4.  Pt will be able to stand with single leg stance for at least 10 sec to improve static balance with functional activities  ?Baseline: 4 sec bil (10/01/21). 30 sec  on R LE; 19 sec on L LE (11/05/21) ?Goal status: goal met 11/05/21 ?  ?5.  Pt will report <7/10 pain in bil knees at worst to improve pain with functional activities such as standing, walking, stairs ?Baseline: 10/10 (10/02/21); 9-10/10 (11/05/21) ?Goal status: progressing conitnue ?ASSESSMENT: ?  ?CLINICAL IMPRESSION:  ?Pt tolerated session well. Reported improved knee stiffness and pain after session. Updated HEP to incorporate hip adductor stretching. ?  ?OBJECTIVE IMPAIRMENTS Abnormal gait, decreased activity tolerance, decreased balance, decreased endurance, decreased mobility, difficulty walking, decreased ROM, decreased strength, hypomobility, increased fascial restrictions, impaired flexibility, postural dysfunction, and pain.  ?  ?ACTIVITY LIMITATIONS cleaning, community activity, driving, laundry, yard work, and shopping.  ?  ?PERSONAL FACTORS Past/current  experiences and Time since onset of injury/illness/exacerbation are also affecting patient's functional outcome.  ?  ?  ?REHAB POTENTIAL: Fair Chronicity ?  ?CLINICAL DECISION MAKING: Stable/uncomplicated ?  ?EVALUATION COMPLEXITY: Low ?  ?  ?PLAN: ?PT FREQUENCY: 1-2x/week ?  ?PT DURATION: 10 sessions (by 12/26/21) ?  ?PLANNED INTERVENTIONS: Therapeutic exercises, Therapeutic activity, Neuromuscular re-education, Balance training, Gait training, Patient/Family education, Joint manipulation, Joint mobilization, Stair training, Orthotic/Fit training, Aquatic Therapy, Cryotherapy, Moist heat, and Manual therapy ?  ?PLAN FOR NEXT SESSION: land visits- check gait speed if not done, continue to work on LE strengthening, stretching and gait. Aquatic- continue to work on gait with decreased UE support, strengthening, balance and flexibility.  ? ? ? ?Willow Ora, PTA, CLT ?[place] ?[address], [address] ?[place], [place] [address] ?[phone] ?11/12/21, 3:05 PM  ? ? ?   ?

## 2021-11-25 ENCOUNTER — Encounter: Payer: Self-pay | Admitting: Physical Therapy

## 2021-11-25 ENCOUNTER — Ambulatory Visit: Payer: No Typology Code available for payment source | Admitting: Physical Therapy

## 2021-11-25 DIAGNOSIS — R2689 Other abnormalities of gait and mobility: Secondary | ICD-10-CM

## 2021-11-25 DIAGNOSIS — M25562 Pain in left knee: Secondary | ICD-10-CM | POA: Diagnosis not present

## 2021-11-25 DIAGNOSIS — M6281 Muscle weakness (generalized): Secondary | ICD-10-CM

## 2021-11-25 DIAGNOSIS — G8929 Other chronic pain: Secondary | ICD-10-CM

## 2021-11-25 NOTE — Therapy (Signed)
?OUTPATIENT PHYSICAL THERAPY TREATMENT NOTE ? ?Patient Name: Reginald Collier ?MRN: [id number] ?DOB:1966-03-20, 56 y.o., male ?Today's Date: 11/26/2021 ? ?PCP: Windy Fast, MD ?REFERRING PROVIDER:  Joelene Millin,* ? ? PT End of Session - 11/25/21 2233   ? ? Visit Number 8   ? Number of Visits 15   ? Date for PT Re-Evaluation 12/26/21   ? Authorization Type [facility] 15 V from 2/17 to 6/17   ? Authorization - Visit Number 8   ? Authorization - Number of Visits 15   ? Progress Note Due on Visit 15   ? PT Start Time [phone]   ? PT Stop Time 0925   ? PT Time Calculation (min) 43 min   ? Equipment Utilized During Treatment Other (comment)   single bar bells, aquatic cuffs  ? Activity Tolerance Patient tolerated treatment well   ? Behavior During Therapy [facility] for tasks assessed/performed   ? ?  ?  ? ?  ? ? ?Past Medical History:  ?Diagnosis Date  ? Allergy   ? Asthma   ? Dizziness   ? TIA (transient ischemic attack)   ? ?Past Surgical History:  ?Procedure Laterality Date  ? APPENDECTOMY    ? HAND TENDON SURGERY    ? HERNIA REPAIR    ? KNEE SURGERY    ? scope on both knees  ? ?Patient Active Problem List  ? Diagnosis Date Noted  ? Tinnitus 09/22/2017  ? Paresthesia 02/03/2017  ? Muscle spasm 02/03/2017  ? Facial muscle weakness 02/03/2017  ? Asthma 06/10/2016  ? Left-sided Bell's palsy 01/16/2016  ? Benign paroxysmal positional vertigo 06/17/2015  ? Pituitary adenoma ([place]) 06/17/2015  ? BMI 38.0-38.9,adult 05/27/2014  ? OSA (obstructive sleep apnea) 05/27/2014  ? Osteoarthritis of both knees 05/27/2014  ? Allergic rhinitis 10/12/2012  ? ? ?REFERRING DIAG: BILATERAL PRIMARY OSTEOARTHRITIS OF THE KNEE  ? ?THERAPY DIAG:  ?Chronic pain of left knee ? ?Chronic pain of right knee ? ?Other abnormalities of gait and mobility ? ?Muscle weakness (generalized) ? ?PERTINENT HISTORY:  ?Has had multiple arthroscopic surgeries on both knees; last surgery was left knee in 2018, has had 2 TIAs, one was in 12/2014 and 07/2015. ?  ? ?PRECAUTIONS:  Hypertension, H/O TIA's ? ?SUBJECTIVE: Pt reports aquatic therapy is going well. ?PAIN:  ?Are you having pain? Yes ?NPRS scale: 6/10 ?Pain location: bil knees ?Pain orientation: Bilateral  ?PAIN TYPE: chronic ?Pain description: constant, sharp, dull, aching, and sore   ?Aggravating factors: increased standing/weight bearing, certain movements, immobility ?Relieving factors: heat, ice, stretching at times, pain meds, rest ? ?  ?TODAY'S TREATMENT ?11/25/2021 ?Aquatic therapy at [facility] - pool temp 92 degrees ? ?Patient seen for aquatic therapy today.  Treatment took place in water 3.5-4.5 feet deep depending upon activity.  Pt entered/exited pool via stairs with bil rails, step to pattern with min guard assist for safety. ? ?Gait from step down to 4.8 foot water depth ?Forward with emphasis on posture and step length for 18 feet across pool for 6 laps ?Backward with emphasis on posture and step length for 18 feet across pool for 6 laps ?Side stepping with emphasis on posture/pelvic position for 18 feet left<>right x 4 laps each way ? ?With single bar bells in ~4.8 water depth ?Walking high knee marching with contralateral hand<>knee taps each time for 18 feet across pool x 4 laps ?Side stepping with bar bells under water- moving arms out<>back in with stepping left<>right for 18 feet x 4 laps  toward each side ? ?At wall in ~4.8-4.5 water depth with bil aquatic cuffs at ankles. Cues on posture and ex form/technique. ?Alternating hip abd/add x 15 reps ?Alternating hip ext x 15 reps ?Alternating hamstring curls x 15 reps ?Alternating slow, high knee marching x 15 reps ? ?With Aquatic step in 4.5-4.6 water depth ?Forward step ups with contralateral march for 15 reps each side ?Lateral step ups with contralateral hip side kick out for 15 reps each side ? ?Pt requires buoyancy of water for support for reduced fall risk with gait training and balance exercises with minimal UE support; exercises able to be performed safely  in water without the risk of fall compared to those same exercises performed on land;  viscosity of water needed for resistance for strengthening.  Current of water provides perturbations for challenging static & dynamic standing balance.    ? ?  ?  ?PATIENT EDUCATION:  ?Education details: Instructed in HEP, see above ?Person educated: Patient ?Education method: Explanation ?Education comprehension: verbalized understanding ?  ?  ?HOME EXERCISE PROGRAM: ?Access Code [email or web address] ?  ? ?  ?  ?GOALS: ?Goals reviewed with patient? Yes ?  ?SHORT TERM GOALS: = Long term goals ?  ?LONG TERM GOALS: Target date: 12/26/2021 ?  ?Patient will demo 100 deg of flexion AROM in L knee to improve transfers with sit to stand ?Baseline: 10/15/21-105 deg L; 108 deg R ?Goal status: goal met 10/15/21 ?  ?2.  Pt will demo -5 to 0 deg of knee extension bil to improve knee extendion during stance phase and improve gait endurance. ?Baseline: -5 on R and -10 on L (10/01/21);unchanged  on 11/05/21 ?Goal status: Progressing, continue ?  ?3.  Pt will demo 12 reps or better with 30 sec chair stand test without use of UE to improve functional strength. Goal revised 11/05/21: pt will be able to perform >14 reps in 30 sec ?Baseline: 12 reps (11/05/21) ?Goal status: Goal met for 12 reps, progressing towards new goal. ?  ?4.  Pt will be able to stand with single leg stance for at least 10 sec to improve static balance with functional activities  ?Baseline: 30 sec on R LE; 19 sec on L LE (11/05/21) ?Goal status: goal met 11/05/21 ?  ?5.  Pt will report <7/10 pain in bil knees at worst to improve pain with functional activities such as standing, walking, stairs ?Baseline: 10/10 (10/02/21); 9-10/10 (11/05/21) ?Goal status: progressing, conitnue ?ASSESSMENT: ?  ?CLINICAL IMPRESSION:  ?  Skilled session continued to focus on gait and strengthening in the aquatic setting with no issues noted or reported. The pt is making progress toward goals and should benefit from  continued PT to progress toward unmet goals.  ?  ?OBJECTIVE IMPAIRMENTS Abnormal gait, decreased activity tolerance, decreased balance, decreased endurance, decreased mobility, difficulty walking, decreased ROM, decreased strength, hypomobility, increased fascial restrictions, impaired flexibility, postural dysfunction, and pain.  ?  ?ACTIVITY LIMITATIONS cleaning, community activity, driving, laundry, yard work, and shopping.  ?  ?PERSONAL FACTORS Past/current experiences and Time since onset of injury/illness/exacerbation are also affecting patient's functional outcome.  ?  ?  ?REHAB POTENTIAL: Fair Chronicity ?  ?CLINICAL DECISION MAKING: Stable/uncomplicated ?  ?EVALUATION COMPLEXITY: Low ?  ?  ?PLAN: ?PT FREQUENCY: 1-2x/week ?  ?PT DURATION: 10 sessions (by 12/26/21) ?  ?PLANNED INTERVENTIONS: Therapeutic exercises, Therapeutic activity, Neuromuscular re-education, Balance training, Gait training, Patient/Family education, Joint manipulation, Joint mobilization, Stair training, Orthotic/Fit training, Aquatic Therapy, Cryotherapy, Moist heat, and Manual therapy ?  ?  PLAN FOR NEXT SESSION: land visits-  continue to work on LE strengthening, stretching and gait. Aquatic- continue to work on gait with decreased UE support, strengthening, balance and flexibility.  ? ? ? ?Willow Ora, PTA, CLT ?[place] ?[address] ?[place], [place] [address] ?[phone] ?11/26/21, 10:01 AM  ? ? ?   ?

## 2021-12-01 ENCOUNTER — Ambulatory Visit: Payer: No Typology Code available for payment source | Admitting: Physical Therapy

## 2021-12-01 ENCOUNTER — Encounter: Payer: Self-pay | Admitting: Physical Therapy

## 2021-12-01 DIAGNOSIS — R2689 Other abnormalities of gait and mobility: Secondary | ICD-10-CM

## 2021-12-01 DIAGNOSIS — M6281 Muscle weakness (generalized): Secondary | ICD-10-CM

## 2021-12-01 DIAGNOSIS — M25562 Pain in left knee: Secondary | ICD-10-CM | POA: Diagnosis not present

## 2021-12-01 DIAGNOSIS — G8929 Other chronic pain: Secondary | ICD-10-CM

## 2021-12-01 NOTE — Therapy (Signed)
OUTPATIENT PHYSICAL THERAPY TREATMENT NOTE  Patient Name: Reginald Collier MRN: [id number] DOB:11-28-1965, 56 y.o., male Today's Date: 12/01/2021  PCP: Windy Fast, MD REFERRING PROVIDER:  Joelene Millin,*   PT End of Session - 12/01/21 1154     Visit Number 9    Number of Visits 15    Date for PT Re-Evaluation 12/26/21    Authorization Type [facility] 15 V from 2/17 to 6/17    Authorization - Visit Number 9    Authorization - Number of Visits 15    Progress Note Due on Visit 15    PT Start Time 1058    PT Stop Time 1145    PT Time Calculation (min) 47 min    Equipment Utilized During Treatment Other (comment)   single bar bells, aquatic cuffs, aquatic step   Activity Tolerance Patient tolerated treatment well    Behavior During Therapy WFL for tasks assessed/performed             Past Medical History:  Diagnosis Date   Allergy    Asthma    Dizziness    TIA (transient ischemic attack)    Past Surgical History:  Procedure Laterality Date   APPENDECTOMY     HAND TENDON SURGERY     HERNIA REPAIR     KNEE SURGERY     scope on both knees   Patient Active Problem List   Diagnosis Date Noted   Tinnitus 09/22/2017   Paresthesia 02/03/2017   Muscle spasm 02/03/2017   Facial muscle weakness 02/03/2017   Asthma 06/10/2016   Left-sided Bell's palsy 01/16/2016   Benign paroxysmal positional vertigo 06/17/2015   Pituitary adenoma ([place]) 06/17/2015   BMI 38.0-38.9,adult 05/27/2014   OSA (obstructive sleep apnea) 05/27/2014   Osteoarthritis of both knees 05/27/2014   Allergic rhinitis 10/12/2012    REFERRING DIAG: BILATERAL PRIMARY OSTEOARTHRITIS OF THE KNEE   THERAPY DIAG:  Other abnormalities of gait and mobility  Muscle weakness (generalized)  Chronic pain of right knee  Chronic pain of left knee  PERTINENT HISTORY:  Has had multiple arthroscopic surgeries on both knees; last surgery was left knee in 2018, has had 2 TIAs, one was in 12/2014 and 07/2015.     PRECAUTIONS: Hypertension, H/O TIA's  SUBJECTIVE: No new complaints. Did have increased calf cramping after walking at church Sunday, unsure why. Got better with heat and rest. Has been checking into gym coverage with various insurance plans with hopes to join the [employer] to continue with aquatic exercise.   PAIN:  Are you having pain? Yes NPRS scale: 6-7/10 Pain location: bil knees Pain orientation: Bilateral  PAIN TYPE: chronic Pain description: constant, sharp, dull, aching, and sore   Aggravating factors: increased standing/weight bearing, certain movements, immobility Relieving factors: heat, ice, stretching at times, pain meds, rest    TODAY'S TREATMENT 12/01/2021 Aquatic therapy at [facility] - pool temp 92 degrees  Patient seen for aquatic therapy today.  Treatment took place in water 3.5-4.5 feet deep depending upon activity.  Pt entered/exited pool via stairs with bil rails, step to pattern with min guard assist for safety.  Gait from step down to 4.8 foot water depth Forward with emphasis on posture and step length for 18 feet across pool for 10 laps Backward with emphasis on posture and step length for 18 feet across pool for 10 laps Side stepping with emphasis on posture/pelvic position for 18 feet left<>right x 8 laps each way  With single bar bells in ~4.8 water  depth Walking high knee marching with contralateral hand<>knee taps each time for 18 feet across pool x 6 laps Walking forward lunges while moving arms out<>in for 18 feet across pool x 6 laps  At wall in ~4.8-4.5 water depth with bil aquatic cuffs at ankles. Cues on posture and ex form/technique. Alternating hip abd/add x 15 reps Alternating hip ext x 15 reps Alternating hamstring curls x 15 reps Alternating slow, high knee marching x 15 reps  With Aquatic step in 4.5-4.6 water depth Forward step ups with contralateral march for 15 reps each side Lateral step ups with contralateral hip side kick out for  15 reps each side Standing on step with bil feet: alternating heel taps to floor/back onto step for 15 reps each side with single UE support at times on wall for balance  Pt requires buoyancy of water for support for reduced fall risk with gait training and balance exercises with minimal UE support; exercises able to be performed safely in water without the risk of fall compared to those same exercises performed on land;  viscosity of water needed for resistance for strengthening.  Current of water provides perturbations for challenging static & dynamic standing balance.         PATIENT EDUCATION:  Education details: continue with current HEP and continue to look into community fitness options Person educated: Patient Education method: Explanation Education comprehension: verbalized understanding     HOME EXERCISE PROGRAM: Access Code [email or web address]        GOALS: Goals reviewed with patient? Yes   SHORT TERM GOALS: = Long term goals   LONG TERM GOALS: Target date: 12/26/2021   Patient will demo 100 deg of flexion AROM in L knee to improve transfers with sit to stand Baseline: 10/15/21-105 deg L; 108 deg R Goal status: goal met 10/15/21   2.  Pt will demo -5 to 0 deg of knee extension bil to improve knee extendion during stance phase and improve gait endurance. Baseline: -5 on R and -10 on L (10/01/21);unchanged  on 11/05/21 Goal status: Progressing, continue   3.  Pt will demo 12 reps or better with 30 sec chair stand test without use of UE to improve functional strength. Goal revised 11/05/21: pt will be able to perform >14 reps in 30 sec Baseline: 12 reps (11/05/21) Goal status: Goal met for 12 reps, progressing towards new goal.   4.  Pt will be able to stand with single leg stance for at least 10 sec to improve static balance with functional activities  Baseline: 30 sec on R LE; 19 sec on L LE (11/05/21) Goal status: goal met 11/05/21   5.  Pt will report <7/10 pain in bil knees at  worst to improve pain with functional activities such as standing, walking, stairs Baseline: 10/10 (10/02/21); 9-10/10 (11/05/21) Goal status: progressing, conitnue ASSESSMENT:   CLINICAL IMPRESSION:    Skilled session continued to focus on gait and strengthening in the aquatic setting with no issues noted or reported. Pt able to tolerate increased walking/activity without increased pain. Pt also reported legs not feeling as heavy with getting out at end of session compared to last aquatic session. The pt is making progress toward goals and should benefit from continued PT to progress toward unmet goals.    OBJECTIVE IMPAIRMENTS Abnormal gait, decreased activity tolerance, decreased balance, decreased endurance, decreased mobility, difficulty walking, decreased ROM, decreased strength, hypomobility, increased fascial restrictions, impaired flexibility, postural dysfunction, and pain.    ACTIVITY LIMITATIONS cleaning,  community activity, driving, laundry, yard work, and shopping.    PERSONAL FACTORS Past/current experiences and Time since onset of injury/illness/exacerbation are also affecting patient's functional outcome.      REHAB POTENTIAL: Fair Chronicity   CLINICAL DECISION MAKING: Stable/uncomplicated   EVALUATION COMPLEXITY: Low     PLAN: PT FREQUENCY: 1-2x/week   PT DURATION: 10 sessions (by 12/26/21)   PLANNED INTERVENTIONS: Therapeutic exercises, Therapeutic activity, Neuromuscular re-education, Balance training, Gait training, Patient/Family education, Joint manipulation, Joint mobilization, Stair training, Orthotic/Fit training, Aquatic Therapy, Cryotherapy, Moist heat, and Manual therapy   PLAN FOR NEXT SESSION: land visits-  continue to work on LE strengthening, stretching and gait. Aquatic- continue to work on gait with decreased UE support, strengthening, balance and flexibility.     Willow Ora, PTA, [place] [address], [place], [place] [address] [phone] 12/01/21, 11:55 AM

## 2021-12-03 ENCOUNTER — Ambulatory Visit: Payer: No Typology Code available for payment source

## 2021-12-03 DIAGNOSIS — M6281 Muscle weakness (generalized): Secondary | ICD-10-CM

## 2021-12-03 DIAGNOSIS — G8929 Other chronic pain: Secondary | ICD-10-CM

## 2021-12-03 DIAGNOSIS — M25562 Pain in left knee: Secondary | ICD-10-CM | POA: Diagnosis not present

## 2021-12-03 DIAGNOSIS — R2689 Other abnormalities of gait and mobility: Secondary | ICD-10-CM

## 2021-12-03 NOTE — Therapy (Signed)
OUTPATIENT PHYSICAL THERAPY TREATMENT NOTE  Patient Name: Reginald Collier MRN: [id number] DOB:January 06, 1966, 56 y.o., male Today's Date: 12/03/2021  PCP: Windy Fast, MD REFERRING PROVIDER:  Joelene Millin,*   PT End of Session - 12/03/21 0939     Visit Number 10    Number of Visits 15    Date for PT Re-Evaluation 12/26/21    Authorization Type [place] 15 V from 2/17 to 6/17    Authorization - Visit Number 10    Authorization - Number of Visits 15    Progress Note Due on Visit 15    PT Start Time 0935    PT Stop Time 1020    PT Time Calculation (min) 45 min    Equipment Utilized During Treatment Gait belt   single bar bells, aquatic cuffs, aquatic step   Activity Tolerance Patient tolerated treatment well    Behavior During Therapy WFL for tasks assessed/performed             Past Medical History:  Diagnosis Date   Allergy    Asthma    Dizziness    TIA (transient ischemic attack)    Past Surgical History:  Procedure Laterality Date   APPENDECTOMY     HAND TENDON SURGERY     HERNIA REPAIR     KNEE SURGERY     scope on both knees   Patient Active Problem List   Diagnosis Date Noted   Tinnitus 09/22/2017   Paresthesia 02/03/2017   Muscle spasm 02/03/2017   Facial muscle weakness 02/03/2017   Asthma 06/10/2016   Left-sided Bell's palsy 01/16/2016   Benign paroxysmal positional vertigo 06/17/2015   Pituitary adenoma ([place]) 06/17/2015   BMI 38.0-38.9,adult 05/27/2014   OSA (obstructive sleep apnea) 05/27/2014   Osteoarthritis of both knees 05/27/2014   Allergic rhinitis 10/12/2012    REFERRING DIAG: BILATERAL PRIMARY OSTEOARTHRITIS OF THE KNEE   THERAPY DIAG:  Other abnormalities of gait and mobility  Muscle weakness (generalized)  Chronic pain of right knee  Chronic pain of left knee  PERTINENT HISTORY:  Has had multiple arthroscopic surgeries on both knees; last surgery was left knee in 2018, has had 2 TIAs, one was in 12/2014 and 07/2015.     PRECAUTIONS: Hypertension, H/O TIA's  SUBJECTIVE: He is looking into silver sneakers program. He is waiting on stuff to come in mail.  PAIN:  Are you having pain? Yes NPRS scale: 6-7/10 Pain location: bil knees Pain orientation: Bilateral  PAIN TYPE: chronic Pain description: constant, sharp, dull, aching, and sore   Aggravating factors: increased standing/weight bearing, certain movements, immobility Relieving factors: heat, ice, stretching at times, pain meds, rest    TODAY'S TREATMENT 12/03/21: Doorway hamstring stretch: 3 x 1' R and L Quadruped to heel sitting stretch for knee flexion: 5 x 20" bil Bil leg press: 110 lbs 4 x 10 Tandem walking fwd and bwd: 20 feet each way Unilateral heel raise: 4 x 5 R and L 5 sec sit to stand: 9 sec without HHA 30 sec sit to stand       PATIENT EDUCATION:  Education details: continue with current HEP and continue to look into community fitness options Person educated: Patient Education method: Explanation Education comprehension: verbalized understanding     HOME EXERCISE PROGRAM: Access Code [email or web address]        GOALS: Goals reviewed with patient? Yes   SHORT TERM GOALS: = Long term goals   LONG TERM GOALS: Target date: 12/26/2021   Patient will demo  100 deg of flexion AROM in L knee to improve transfers with sit to stand Baseline: 10/15/21-105 deg L; 108 deg R Goal status: goal met 10/15/21   2.  Pt will demo -5 to 0 deg of knee extension bil to improve knee extendion during stance phase and improve gait endurance. Baseline: -5 on R and -10 on L (10/01/21);unchanged  on 11/05/21 Goal status: Progressing, continue   3.  Pt will demo 12 reps or better with 30 sec chair stand test without use of UE to improve functional strength. Goal revised 11/05/21: pt will be able to perform >14 reps in 30 sec Baseline: 12 reps (11/05/21); 15 (12/03/21) Goal status: Goal met for 12 reps, progressing towards new goal. Goal met for 15 reps on  12/03/21   4.  Pt will be able to stand with single leg stance for at least 10 sec to improve static balance with functional activities  Baseline: 30 sec on R LE; 19 sec on L LE (11/05/21) Goal status: goal met 11/05/21   5.  Pt will report <7/10 pain in bil knees at worst to improve pain with functional activities such as standing, walking, stairs Baseline: 10/10 (10/02/21); 9-10/10 (11/05/21) Goal status: progressing, conitnue ASSESSMENT:   CLINICAL IMPRESSION:  Pt tolerated session well. Pt was given updated HEP to progress his hamstring and quad stretching with prolonged duration and increased WB. Pt able to get up and down from floor with SBA.  Met long term goal for 30 sec sit to stand. Progressing towards long term goal #5.   OBJECTIVE IMPAIRMENTS Abnormal gait, decreased activity tolerance, decreased balance, decreased endurance, decreased mobility, difficulty walking, decreased ROM, decreased strength, hypomobility, increased fascial restrictions, impaired flexibility, postural dysfunction, and pain.    ACTIVITY LIMITATIONS cleaning, community activity, driving, laundry, yard work, and shopping.    PERSONAL FACTORS Past/current experiences and Time since onset of injury/illness/exacerbation are also affecting patient's functional outcome.      REHAB POTENTIAL: Fair Chronicity   CLINICAL DECISION MAKING: Stable/uncomplicated   EVALUATION COMPLEXITY: Low     PLAN: PT FREQUENCY: 1-2x/week   PT DURATION: 10 sessions (by 12/26/21)   PLANNED INTERVENTIONS: Therapeutic exercises, Therapeutic activity, Neuromuscular re-education, Balance training, Gait training, Patient/Family education, Joint manipulation, Joint mobilization, Stair training, Orthotic/Fit training, Aquatic Therapy, Cryotherapy, Moist heat, and Manual therapy   PLAN FOR NEXT SESSION: Discharge conversation already initiated with patient with D/C planning of 12/26/21    Kerrie Pleasure, PT 12/03/2021, 10:21 AM

## 2021-12-09 ENCOUNTER — Ambulatory Visit: Payer: No Typology Code available for payment source | Admitting: Physical Therapy

## 2021-12-09 ENCOUNTER — Encounter: Payer: Self-pay | Admitting: Physical Therapy

## 2021-12-09 DIAGNOSIS — M6281 Muscle weakness (generalized): Secondary | ICD-10-CM

## 2021-12-09 DIAGNOSIS — R2689 Other abnormalities of gait and mobility: Secondary | ICD-10-CM

## 2021-12-09 DIAGNOSIS — M25562 Pain in left knee: Secondary | ICD-10-CM | POA: Diagnosis not present

## 2021-12-09 DIAGNOSIS — G8929 Other chronic pain: Secondary | ICD-10-CM

## 2021-12-09 NOTE — Therapy (Signed)
OUTPATIENT PHYSICAL THERAPY TREATMENT NOTE  Patient Name: Reginald Collier MRN: [id number] DOB:[birth year], 56 y.o., male Today's Date: 12/09/2021  PCP: Windy Fast, MD REFERRING PROVIDER:  Joelene Millin,*   PT End of Session - 12/09/21 2018     Visit Number 11    Number of Visits 15    Date for PT Re-Evaluation 12/26/21    Authorization Type [place] 15 V from 2/17 to 6/17    Authorization - Visit Number 11    Authorization - Number of Visits 15    Progress Note Due on Visit 15    PT Start Time 0930    PT Stop Time 1013    PT Time Calculation (min) 43 min    Equipment Utilized During Treatment --   single bar bells, aquatic cuffs, pool noodle, double bar bells   Activity Tolerance Patient tolerated treatment well    Behavior During Therapy WFL for tasks assessed/performed             Past Medical History:  Diagnosis Date   Allergy    Asthma    Dizziness    TIA (transient ischemic attack)    Past Surgical History:  Procedure Laterality Date   APPENDECTOMY     HAND TENDON SURGERY     HERNIA REPAIR     KNEE SURGERY     scope on both knees   Patient Active Problem List   Diagnosis Date Noted   Tinnitus 09/22/2017   Paresthesia 02/03/2017   Muscle spasm 02/03/2017   Facial muscle weakness 02/03/2017   Asthma 06/10/2016   Left-sided Bell's palsy 01/16/2016   Benign paroxysmal positional vertigo 06/17/2015   Pituitary adenoma ([place]) 06/17/2015   BMI 38.0-38.9,adult 05/27/2014   OSA (obstructive sleep apnea) 05/27/2014   Osteoarthritis of both knees 05/27/2014   Allergic rhinitis 10/12/2012    REFERRING DIAG: BILATERAL PRIMARY OSTEOARTHRITIS OF THE KNEE   THERAPY DIAG:  Other abnormalities of gait and mobility  Muscle weakness (generalized)  Chronic pain of right knee  Chronic pain of left knee  PERTINENT HISTORY:  Has had multiple arthroscopic surgeries on both knees; last surgery was left knee in 2018, has had 2 TIAs, one was in 12/2014 and  07/2015.    PRECAUTIONS: Hypertension, H/O TIA's  SUBJECTIVE: No new complaints. Still looking into community fitness options.   PAIN:  Are you having pain? Yes NPRS scale: 7-8/10 Pain location: bil knees Pain orientation: Bilateral  PAIN TYPE: chronic Pain description: constant, sharp, dull, aching, and sore   Aggravating factors: increased standing/weight bearing, certain movements, immobility Relieving factors: heat, ice, stretching at times, pain meds, rest    TODAY'S TREATMENT 12/09/21: Aquatic therapy at [facility] - pool temp 94 degrees  Patient seen for aquatic therapy today.  Treatment took place in water 3.5-4.5 feet deep depending upon activity.  Pt entered/exited pool via stairs with bil rails and reciprocal pattern.   Gait from step down to 4.8 foot water depth Forward with emphasis on posture and step length for 18 feet across pool for 10 laps Backward with emphasis on posture and step length for 18 feet across pool for 10 laps Side stepping with emphasis on posture/pelvic position for 18 feet left<>right x 8 laps each way   At wall in ~4.3-4.5 water depth with bil aquatic 2# ankle weights. Cues on posture and ex form/technique. Alternating hip abd/add x 20 reps Alternating hip ext x 20 reps Alternating hip flexion with knee extension x 20 reps Alternating slow, high  knee marching x 20 reps  With single bar bells in ~4.0 to ~4.3 foot water depth Walking high knee marching with contralateral hand<>knee taps each time for 18 feet across pool x 6 laps Walking forward lunges while moving arms out<>in for 18 feet across pool x 6 laps  In deep end (~4.5 to 4.8 foot depth water) Strattled over yellow pool noodle with double bar bells in hands to assist with balance while bicycling around deeper end for 5 minutes.   At wall in ~4.3-4.5 foot water depth Toes on wall with leaning in for heel cord stretching for 30 seconds x 3 reps Runner stretch for 30 second hold x 3  reps on each side.    Pt requires buoyancy of water for support for reduced fall risk with gait training and balance exercises with minimal UE support; exercises able to be performed safely in water without the risk of fall compared to those same exercises performed on land;  viscosity of water needed for resistance for strengthening.  Current of water provides perturbations for challenging static & dynamic standing balance.          PATIENT EDUCATION:  Education details: continue with current HEP and continue to look into community fitness options Person educated: Patient Education method: Explanation Education comprehension: verbalized understanding     HOME EXERCISE PROGRAM: Access Code [email or web address]        GOALS: Goals reviewed with patient? Yes   SHORT TERM GOALS: = Long term goals   LONG TERM GOALS: Target date: 12/26/2021   Patient will demo 100 deg of flexion AROM in L knee to improve transfers with sit to stand Baseline: 10/15/21-105 deg L; 108 deg R Goal status: goal met 10/15/21   2.  Pt will demo -5 to 0 deg of knee extension bil to improve knee extendion during stance phase and improve gait endurance. Baseline: -5 on R and -10 on L (10/01/21);unchanged  on 11/05/21 Goal status: Progressing, continue   3.  Pt will demo 12 reps or better with 30 sec chair stand test without use of UE to improve functional strength. Goal revised 11/05/21: pt will be able to perform >14 reps in 30 sec Baseline: 12 reps (11/05/21); 15 (12/03/21) Goal status: Goal met for 12 reps, progressing towards new goal. Goal met for 15 reps on 12/03/21   4.  Pt will be able to stand with single leg stance for at least 10 sec to improve static balance with functional activities  Baseline: 30 sec on R LE; 19 sec on L LE (11/05/21) Goal status: goal met 11/05/21   5.  Pt will report <7/10 pain in bil knees at worst to improve pain with functional activities such as standing, walking, stairs Baseline: 10/10  (10/02/21); 9-10/10 (11/05/21) Goal status: progressing, conitnue ASSESSMENT:   CLINICAL IMPRESSION:    Today's skilled session continued to focus on strengthening, balance and activity tolerance in the aquatic setting with no issues noted or reported. Pt did state he felt less tight and had less pain (7-8/10, down to 4-5/10). The pt appears to be making progress toward goals.    OBJECTIVE IMPAIRMENTS Abnormal gait, decreased activity tolerance, decreased balance, decreased endurance, decreased mobility, difficulty walking, decreased ROM, decreased strength, hypomobility, increased fascial restrictions, impaired flexibility, postural dysfunction, and pain.    ACTIVITY LIMITATIONS cleaning, community activity, driving, laundry, yard work, and shopping.    PERSONAL FACTORS Past/current experiences and Time since onset of injury/illness/exacerbation are also affecting patient's functional outcome.  REHAB POTENTIAL: Fair Chronicity   CLINICAL DECISION MAKING: Stable/uncomplicated   EVALUATION COMPLEXITY: Low     PLAN: PT FREQUENCY: 1-2x/week   PT DURATION: 10 sessions (by 12/26/21)   PLANNED INTERVENTIONS: Therapeutic exercises, Therapeutic activity, Neuromuscular re-education, Balance training, Gait training, Patient/Family education, Joint manipulation, Joint mobilization, Stair training, Orthotic/Fit training, Aquatic Therapy, Cryotherapy, Moist heat, and Manual therapy   PLAN FOR NEXT SESSION: Discharge conversation already initiated with patient with D/C planning of 12/26/21. Continue to work on strengthening, stretching and activity tolerance   Willow Ora, PTA, [facility] [address], [place], [place] [address] [phone] 12/09/21, 8:20 PM

## 2021-12-12 ENCOUNTER — Ambulatory Visit: Payer: No Typology Code available for payment source | Attending: Internal Medicine

## 2021-12-12 DIAGNOSIS — M25562 Pain in left knee: Secondary | ICD-10-CM | POA: Insufficient documentation

## 2021-12-12 DIAGNOSIS — M6281 Muscle weakness (generalized): Secondary | ICD-10-CM | POA: Diagnosis present

## 2021-12-12 DIAGNOSIS — G8929 Other chronic pain: Secondary | ICD-10-CM | POA: Insufficient documentation

## 2021-12-12 DIAGNOSIS — R2689 Other abnormalities of gait and mobility: Secondary | ICD-10-CM | POA: Insufficient documentation

## 2021-12-12 DIAGNOSIS — M25561 Pain in right knee: Secondary | ICD-10-CM | POA: Insufficient documentation

## 2021-12-12 NOTE — Therapy (Signed)
OUTPATIENT PHYSICAL THERAPY TREATMENT NOTE  Patient Name: Reginald Collier MRN: [id number] DOB:1965/08/29, 56 y.o., male Today's Date: 12/12/2021  PCP: Windy Fast, MD REFERRING PROVIDER:  Joelene Millin,*   PT End of Session - 12/12/21 0951     Visit Number 12    Number of Visits 15    Date for PT Re-Evaluation 12/26/21    Authorization Type [place] 15 V from 2/17 to 6/17    Authorization - Visit Number 12    Authorization - Number of Visits 15    Progress Note Due on Visit 15    PT Start Time 0930    PT Stop Time 1015    PT Time Calculation (min) 45 min    Equipment Utilized During Treatment --   single bar bells, aquatic cuffs, pool noodle, double bar bells   Activity Tolerance Patient tolerated treatment well    Behavior During Therapy WFL for tasks assessed/performed             Past Medical History:  Diagnosis Date   Allergy    Asthma    Dizziness    TIA (transient ischemic attack)    Past Surgical History:  Procedure Laterality Date   APPENDECTOMY     HAND TENDON SURGERY     HERNIA REPAIR     KNEE SURGERY     scope on both knees   Patient Active Problem List   Diagnosis Date Noted   Tinnitus 09/22/2017   Paresthesia 02/03/2017   Muscle spasm 02/03/2017   Facial muscle weakness 02/03/2017   Asthma 06/10/2016   Left-sided Bell's palsy 01/16/2016   Benign paroxysmal positional vertigo 06/17/2015   Pituitary adenoma ([place]) 06/17/2015   BMI 38.0-38.9,adult 05/27/2014   OSA (obstructive sleep apnea) 05/27/2014   Osteoarthritis of both knees 05/27/2014   Allergic rhinitis 10/12/2012    REFERRING DIAG: BILATERAL PRIMARY OSTEOARTHRITIS OF THE KNEE   THERAPY DIAG:  Other abnormalities of gait and mobility  Muscle weakness (generalized)  Chronic pain of right knee  Chronic pain of left knee  PERTINENT HISTORY:  Has had multiple arthroscopic surgeries on both knees; last surgery was left knee in 2018, has had 2 TIAs, one was in 12/2014 and 07/2015.     PRECAUTIONS: Hypertension, H/O TIA's  SUBJECTIVE: No new complaints. Still looking into community fitness options.   PAIN:  Are you having pain? Yes NPRS scale: 4/10 Pain location: bil knees Pain orientation: Bilateral  PAIN TYPE: chronic Pain description: constant, sharp, dull, aching, and sore   Aggravating factors: increased standing/weight bearing, certain movements, immobility Relieving factors: heat, ice, stretching at times, pain meds, rest    TODAY'S TREATMENT Sci fit: level 8 for 10' Bil leg press: 120 lbs 20x Uni leg press: 80 lbs 2 x 10 R and L Side steps: blue band: 3 x 10 R and L Blue band given for HEP use. Supine knee extension mobilization in Left knee. Supine quad sets: 20x       PATIENT EDUCATION:  Education details: continue with current HEP and continue to look into community fitness options Person educated: Patient Education method: Explanation Education comprehension: verbalized understanding     HOME EXERCISE PROGRAM: Access Code [email or web address]        GOALS: Goals reviewed with patient? Yes   SHORT TERM GOALS: = Long term goals   LONG TERM GOALS: Target date: 12/26/2021   Patient will demo 100 deg of flexion AROM in L knee to improve transfers with sit to stand Baseline:  10/15/21-105 deg L; 108 deg R Goal status: goal met 10/15/21   2.  Pt will demo -5 to 0 deg of knee extension bil to improve knee extendion during stance phase and improve gait endurance. Baseline: -5 on R and -10 on L (10/01/21);unchanged  on 11/05/21 Goal status: Progressing, continue   3.  Pt will demo 12 reps or better with 30 sec chair stand test without use of UE to improve functional strength. Goal revised 11/05/21: pt will be able to perform >14 reps in 30 sec Baseline: 12 reps (11/05/21); 15 (12/03/21) Goal status: Goal met for 12 reps, progressing towards new goal. Goal met for 15 reps on 12/03/21   4.  Pt will be able to stand with single leg stance for at least 10 sec  to improve static balance with functional activities  Baseline: 30 sec on R LE; 19 sec on L LE (11/05/21) Goal status: goal met 11/05/21   5.  Pt will report <7/10 pain in bil knees at worst to improve pain with functional activities such as standing, walking, stairs Baseline: 10/10 (10/02/21); 9-10/10 (11/05/21); 4/10 (12/12/21) Goal status: goal met ASSESSMENT:   CLINICAL IMPRESSION:    LTG #5 met today. 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   OBJECTIVE IMPAIRMENTS Abnormal gait, decreased activity tolerance, decreased balance, decreased endurance, decreased mobility, difficulty walking, decreased ROM, decreased strength, hypomobility, increased fascial restrictions, impaired flexibility, postural dysfunction, and pain.    ACTIVITY LIMITATIONS cleaning, community activity, driving, laundry, yard work, and shopping.    PERSONAL FACTORS Past/current experiences and Time since onset of injury/illness/exacerbation are also affecting patient's functional outcome.      REHAB POTENTIAL: Fair Chronicity   CLINICAL DECISION MAKING: Stable/uncomplicated   EVALUATION COMPLEXITY: Low     PLAN: PT FREQUENCY: 1-2x/week   PT DURATION: 10 sessions (by 12/26/21)   PLANNED INTERVENTIONS: Therapeutic exercises, Therapeutic activity, Neuromuscular re-education, Balance training, Gait training, Patient/Family education, Joint manipulation, Joint mobilization, Stair training, Orthotic/Fit training, Aquatic Therapy, Cryotherapy, Moist heat, and Manual therapy   PLAN FOR NEXT SESSION: Discharge conversation already initiated with patient with D/C planning of 12/26/21. Continue to work on strengthening, stretching and activity tolerance    Kerrie Pleasure, PT 12/12/2021, 10:36 AM

## 2021-12-17 ENCOUNTER — Ambulatory Visit: Payer: No Typology Code available for payment source

## 2021-12-17 DIAGNOSIS — M6281 Muscle weakness (generalized): Secondary | ICD-10-CM

## 2021-12-17 DIAGNOSIS — G8929 Other chronic pain: Secondary | ICD-10-CM

## 2021-12-17 DIAGNOSIS — R2689 Other abnormalities of gait and mobility: Secondary | ICD-10-CM | POA: Diagnosis not present

## 2021-12-17 NOTE — Therapy (Signed)
OUTPATIENT PHYSICAL THERAPY TREATMENT NOTE  Patient Name: Reginald Collier MRN: [id number] DOB:May 15, 1966, 56 y.o., male Today's Date: 12/17/2021  PCP: Windy Fast, MD REFERRING PROVIDER:  Joelene Millin,*   PT End of Session - 12/17/21 1134     Visit Number 13    Number of Visits 15    Date for PT Re-Evaluation 12/26/21    Authorization Type [place] 15 V from 2/17 to 6/17    Authorization - Visit Number 12    Authorization - Number of Visits 15    Progress Note Due on Visit 15    PT Start Time 1110    PT Stop Time 1150    PT Time Calculation (min) 40 min    Equipment Utilized During Treatment --   single bar bells, aquatic cuffs, pool noodle, double bar bells   Activity Tolerance Patient tolerated treatment well    Behavior During Therapy WFL for tasks assessed/performed             Past Medical History:  Diagnosis Date   Allergy    Asthma    Dizziness    TIA (transient ischemic attack)    Past Surgical History:  Procedure Laterality Date   APPENDECTOMY     HAND TENDON SURGERY     HERNIA REPAIR     KNEE SURGERY     scope on both knees   Patient Active Problem List   Diagnosis Date Noted   Tinnitus 09/22/2017   Paresthesia 02/03/2017   Muscle spasm 02/03/2017   Facial muscle weakness 02/03/2017   Asthma 06/10/2016   Left-sided Bell's palsy 01/16/2016   Benign paroxysmal positional vertigo 06/17/2015   Pituitary adenoma ([place]) 06/17/2015   BMI 38.0-38.9,adult 05/27/2014   OSA (obstructive sleep apnea) 05/27/2014   Osteoarthritis of both knees 05/27/2014   Allergic rhinitis 10/12/2012    REFERRING DIAG: BILATERAL PRIMARY OSTEOARTHRITIS OF THE KNEE   THERAPY DIAG:  Other abnormalities of gait and mobility  Muscle weakness (generalized)  Chronic pain of right knee  Chronic pain of left knee  PERTINENT HISTORY:  Has had multiple arthroscopic surgeries on both knees; last surgery was left knee in 2018, has had 2 TIAs, one was in 12/2014 and 07/2015.     PRECAUTIONS: Hypertension, H/O TIA's  SUBJECTIVE: Pt reports his knees are achy because of the rainy weather today.  PAIN:  Are you having pain? Yes NPRS scale: 6/10 Pain location: bil knees Pain orientation: Bilateral  PAIN TYPE: chronic Pain description: constant, sharp, dull, aching, and sore   Aggravating factors: increased standing/weight bearing, certain movements, immobility Relieving factors: heat, ice, stretching at times, pain meds, rest    TODAY'S TREATMENT Sci fit: level 8 for 10' Calf stretching with 1/2 foam rolL: 5 x 45" bil Bil leg press: 110 lbs 20x Uni leg press: 70 lbs with dyna disc under foot, 2 x 10 R and L, for L LE decreased weight to 50lbs during 2nd set due to pt reporting increased soreness after first set Gait training: 1 x 230' with st. Cane, decreased cadence due to increased soreness after above exercsies. Discussed performing strength training with UE and LE in gym to continue to help with strength building and weight loss goal that pt has. Pt is still looking into getting silver sneaker program to help with aquatics.       PATIENT EDUCATION:  Education details: continue with current HEP and continue to look into community fitness options Person educated: Patient Education method: Explanation Education comprehension:  verbalized understanding     HOME EXERCISE PROGRAM: Access Code [email or web address]        GOALS: Goals reviewed with patient? Yes   SHORT TERM GOALS: = Long term goals   LONG TERM GOALS: Target date: 12/26/2021   Patient will demo 100 deg of flexion AROM in L knee to improve transfers with sit to stand Baseline: 10/15/21-105 deg L; 108 deg R Goal status: goal met 10/15/21   2.  Pt will demo -5 to 0 deg of knee extension bil to improve knee extendion during stance phase and improve gait endurance. Baseline: -5 on R and -10 on L (10/01/21);unchanged  on 11/05/21 Goal status: Progressing, continue   3.  Pt will demo 12 reps or better  with 30 sec chair stand test without use of UE to improve functional strength. Goal revised 11/05/21: pt will be able to perform >14 reps in 30 sec Baseline: 12 reps (11/05/21); 15 (12/03/21) Goal status: Goal met for 12 reps, progressing towards new goal. Goal met for 15 reps on 12/03/21   4.  Pt will be able to stand with single leg stance for at least 10 sec to improve static balance with functional activities  Baseline: 30 sec on R LE; 19 sec on L LE (11/05/21) Goal status: goal met 11/05/21   5.  Pt will report <7/10 pain in bil knees at worst to improve pain with functional activities such as standing, walking, stairs Baseline: 10/10 (10/02/21); 9-10/10 (11/05/21); 4/10 (12/12/21) Goal status: goal met ASSESSMENT:   CLINICAL IMPRESSION:   Increased soreness reported by patient with exercises and resistance was modified to accomodate increase soreness in his knees today.   OBJECTIVE IMPAIRMENTS Abnormal gait, decreased activity tolerance, decreased balance, decreased endurance, decreased mobility, difficulty walking, decreased ROM, decreased strength, hypomobility, increased fascial restrictions, impaired flexibility, postural dysfunction, and pain.    ACTIVITY LIMITATIONS cleaning, community activity, driving, laundry, yard work, and shopping.    PERSONAL FACTORS Past/current experiences and Time since onset of injury/illness/exacerbation are also affecting patient's functional outcome.      REHAB POTENTIAL: Fair Chronicity   CLINICAL DECISION MAKING: Stable/uncomplicated   EVALUATION COMPLEXITY: Low     PLAN: PT FREQUENCY: 1-2x/week   PT DURATION: 10 sessions (by 12/26/21)   PLANNED INTERVENTIONS: Therapeutic exercises, Therapeutic activity, Neuromuscular re-education, Balance training, Gait training, Patient/Family education, Joint manipulation, Joint mobilization, Stair training, Orthotic/Fit training, Aquatic Therapy, Cryotherapy, Moist heat, and Manual therapy   PLAN FOR NEXT  SESSION: Discharge conversation already initiated with patient with D/C planning of 12/26/21. Continue to work on strengthening, stretching and activity tolerance    Kerrie Pleasure, PT 12/17/2021, 12:10 PM

## 2021-12-26 ENCOUNTER — Ambulatory Visit: Payer: No Typology Code available for payment source

## 2021-12-26 DIAGNOSIS — G8929 Other chronic pain: Secondary | ICD-10-CM

## 2021-12-26 DIAGNOSIS — R2689 Other abnormalities of gait and mobility: Secondary | ICD-10-CM

## 2021-12-26 DIAGNOSIS — M6281 Muscle weakness (generalized): Secondary | ICD-10-CM

## 2021-12-26 NOTE — Therapy (Signed)
OUTPATIENT PHYSICAL THERAPY DISCHARGE NOTE  Patient Name: Reginald Collier MRN: [id number] DOB:10-05-1965, 56 y.o., male Today's Date: 12/26/2021  PCP: Windy Fast, MD REFERRING PROVIDER:  Joelene Millin,*   PT End of Session - 12/26/21 1111     Visit Number 14    Number of Visits 15    Date for PT Re-Evaluation 12/26/21    Authorization Type VA 15 V from 2/17 to 6/17    Authorization - Visit Number 12    Authorization - Number of Visits 15    Progress Note Due on Visit 15    Equipment Utilized During Treatment --   single bar bells, aquatic cuffs, pool noodle, double bar bells   Activity Tolerance Patient tolerated treatment well    Behavior During Therapy WFL for tasks assessed/performed             Past Medical History:  Diagnosis Date   Allergy    Asthma    Dizziness    TIA (transient ischemic attack)    Past Surgical History:  Procedure Laterality Date   APPENDECTOMY     HAND TENDON SURGERY     HERNIA REPAIR     KNEE SURGERY     scope on both knees   Patient Active Problem List   Diagnosis Date Noted   Tinnitus 09/22/2017   Paresthesia 02/03/2017   Muscle spasm 02/03/2017   Facial muscle weakness 02/03/2017   Asthma 06/10/2016   Left-sided Bell's palsy 01/16/2016   Benign paroxysmal positional vertigo 06/17/2015   Pituitary adenoma ([place]) 06/17/2015   BMI 38.0-38.9,adult 05/27/2014   OSA (obstructive sleep apnea) 05/27/2014   Osteoarthritis of both knees 05/27/2014   Allergic rhinitis 10/12/2012    REFERRING DIAG: BILATERAL PRIMARY OSTEOARTHRITIS OF THE KNEE   THERAPY DIAG:  Other abnormalities of gait and mobility  Muscle weakness (generalized)  Chronic pain of right knee  Chronic pain of left knee  PERTINENT HISTORY:  Has had multiple arthroscopic surgeries on both knees; last surgery was left knee in 2018, has had 2 TIAs, one was in 12/2014 and 07/2015.    PRECAUTIONS: Hypertension, H/O TIA's  SUBJECTIVE: Pt reports that he has  contacted his neurologists to see if he can do light strength training.  PAIN:  Are you having pain? Yes NPRS scale: 6/10 Pain location: bil knees Pain orientation: Bilateral  PAIN TYPE: chronic Pain description: constant, sharp, dull, aching, and sore   Aggravating factors: increased standing/weight bearing, certain movements, immobility Relieving factors: heat, ice, stretching at times, pain meds, rest    TODAY'S TREATMENT Sci fit: level 10 for 10' Pt educated on when he joins [employer] or fitness center, he can use weight machines for legs with moderate weights. Pt educated on correct amount of weight should be determined by to see if he can finish 10 reps with good form with last couple of reps being challenging moderately. Pt should do 2-3 reps of each exercises (leg press, hamstring curls, hip abductors/hip adductors, calf raises)  Reviwed following exercise program for HEP:  Standing Gastroc Stretch on Foam 1/2 Roll   REPS: 3 HOLD: 30 DAILY: 2 WEEKLY: 7 Hamstring Stretch in Doorway  REPS: 3 HOLD: 1 MIN DAILY: 1 WEEKLY: 7 Sit to Stand with Arms Crossed   REPS: 10 SETS: 2 DAILY: 1 WEEKLY: 7 Heel Raises with Counter Support   REPS: 10 SETS: 2 DAILY: 1 WEEKLY: 7 Side Stepping with Resistance at Ankles- discussed 2 variations: 10 steps each way for 2-3 steps or  one step each way quickly for 30 sec; 2-3x   REPS: 10 SETS: 2 DAILY: 1 WEEKLY: 7   Also reviewed following equipment that he can utilize in the gym. Pictures of equipment printed for patient to discuss with trainers for safe use. - seated leg press - seated hamstring curls or prone hamstring curls - standing calf raises - seated hip abductor/adductors     PATIENT EDUCATION:  Education details: continue with current HEP and continue to look into community fitness options Person educated: Patient Education method: Explanation Education comprehension: verbalized understanding     HOME EXERCISE PROGRAM: Access  Code [email or web address]        GOALS: Goals reviewed with patient? Yes   SHORT TERM GOALS: = Long term goals   LONG TERM GOALS: Target date: 12/26/2021   Patient will demo 100 deg of flexion AROM in L knee to improve transfers with sit to stand Baseline: 10/15/21-105 deg L; 108 deg R Goal status: goal met 10/15/21   2.  Pt will demo -5 to 0 deg of knee extension bil to improve knee extendion during stance phase and improve gait endurance. Baseline: -5 on R and -10 on L (10/01/21);unchanged  on 11/05/21 Goal status: Progressing, continue   3.  Pt will demo 12 reps or better with 30 sec chair stand test without use of UE to improve functional strength. Goal revised 11/05/21: pt will be able to perform >14 reps in 30 sec Baseline: 12 reps (11/05/21); 15 (12/03/21) Goal status: Goal met for 12 reps, progressing towards new goal. Goal met for 15 reps on 12/03/21   4.  Pt will be able to stand with single leg stance for at least 10 sec to improve static balance with functional activities  Baseline: 30 sec on R LE; 19 sec on L LE (11/05/21) Goal status: goal met 11/05/21   5.  Pt will report <7/10 pain in bil knees at worst to improve pain with functional activities such as standing, walking, stairs Baseline: 10/10 (10/02/21); 9-10/10 (11/05/21); 4/10 (12/12/21) Goal status: goal met ASSESSMENT:   CLINICAL IMPRESSION:  Pt has been seen for total of 14 sessions from 10/01/21 to 12/26/21. Pt has progressed well and has majority of STG and LTG. Pt has reached maximum potential in therapy at this point. Pt will be discharged from skilled PT with indpendent home exercise program.   OBJECTIVE IMPAIRMENTS Abnormal gait, decreased activity tolerance, decreased balance, decreased endurance, decreased mobility, difficulty walking, decreased ROM, decreased strength, hypomobility, increased fascial restrictions, impaired flexibility, postural dysfunction, and pain.    ACTIVITY LIMITATIONS cleaning, community activity,  driving, laundry, yard work, and shopping.    PERSONAL FACTORS Past/current experiences and Time since onset of injury/illness/exacerbation are also affecting patient's functional outcome.      REHAB POTENTIAL: Fair Chronicity   CLINICAL DECISION MAKING: Stable/uncomplicated   EVALUATION COMPLEXITY: Low     PLAN: Discharge from skilled PT    Kerrie Pleasure, PT 12/26/2021, 11:12 AM

## 2022-10-06 ENCOUNTER — Encounter (HOSPITAL_COMMUNITY): Payer: Self-pay | Admitting: Emergency Medicine

## 2022-10-06 ENCOUNTER — Ambulatory Visit (HOSPITAL_COMMUNITY)
Admission: EM | Admit: 2022-10-06 | Discharge: 2022-10-06 | Disposition: A | Discharge status: Home / Self Care | Payer: No Typology Code available for payment source

## 2022-10-06 DIAGNOSIS — J069 Acute upper respiratory infection, unspecified: Secondary | ICD-10-CM | POA: Insufficient documentation

## 2022-10-06 DIAGNOSIS — R059 Cough, unspecified: Secondary | ICD-10-CM | POA: Diagnosis not present

## 2022-10-06 DIAGNOSIS — J4520 Mild intermittent asthma, uncomplicated: Secondary | ICD-10-CM | POA: Insufficient documentation

## 2022-10-06 DIAGNOSIS — U071 COVID-19: Secondary | ICD-10-CM | POA: Insufficient documentation

## 2022-10-06 HISTORY — DX: Essential (primary) hypertension: I10

## 2022-10-06 HISTORY — DX: Type 2 diabetes mellitus without complications: E11.9

## 2022-10-06 MED ORDER — GUAIFENESIN ER 1200 MG PO TB12: 1200.0000 mg | ORAL_TABLET | Freq: Two times a day (BID) | ORAL | 0 refills | Status: AC

## 2022-10-06 MED ORDER — BENZONATATE 100 MG PO CAPS: 100.0000 mg | ORAL_CAPSULE | Freq: Three times a day (TID) | ORAL | 0 refills | Status: DC

## 2022-10-06 NOTE — ED Provider Notes (Signed)
[place]    CSN: [id number] Arrival date & time: 10/06/22  1231      History   Chief Complaint Chief Complaint  Patient presents with   Nasal Congestion    HPI Reginald Collier is a 57 y.o. male.   Patient presents to urgent care for evaluation of nasal congestion, cough, and fatigue that started 2 days ago on Sunday, October 04, 2022.  States nasal congestion causes dripping down the back of his throat and slight sore throat.  Denies ear pain, tinnitus, dizziness, nausea, vomiting, diarrhea, abdominal pain, rash, shortness of breath, chest pain, heart palpitations, and headache.  No recent known sick contacts with similar symptoms.  Takes blood thinner due to history of TIA.  No vision changes or weakness.  History of asthma, has not needed to use his albuterol inhaler during this illness.  Non-smoker, denies drug use.  Has attempted use of nasal spray before coming to urgent care without much relief of nasal congestion.     Past Medical History:  Diagnosis Date   Allergy    Asthma    Diabetes mellitus without complication ([place])    Dizziness    Hypertension    TIA (transient ischemic attack)     Patient Active Problem List   Diagnosis Date Noted   Tinnitus 09/22/2017   Paresthesia 02/03/2017   Muscle spasm 02/03/2017   Facial muscle weakness 02/03/2017   Asthma 06/10/2016   Left-sided Bell's palsy 01/16/2016   Benign paroxysmal positional vertigo 06/17/2015   Pituitary adenoma ([place]) 06/17/2015   BMI 38.0-38.9,adult 05/27/2014   OSA (obstructive sleep apnea) 05/27/2014   Osteoarthritis of both knees 05/27/2014   Allergic rhinitis 10/12/2012    Past Surgical History:  Procedure Laterality Date   APPENDECTOMY     HAND TENDON SURGERY     HERNIA REPAIR     KNEE SURGERY     scope on both knees       Home Medications    Prior to Admission medications   Medication Sig Start Date End Date Taking? Authorizing Provider  benzonatate (TESSALON) 100 MG  capsule Take 1 capsule (100 mg total) by mouth every 8 (eight) hours. 10/06/22  Yes Talbot Grumbling, FNP  Guaifenesin 1200 MG TB12 Take 1 tablet (1,200 mg total) by mouth in the morning and at bedtime. 10/06/22  Yes Talbot Grumbling, FNP  JARDIANCE 25 MG TABS tablet Take 25 mg by mouth daily. 05/07/22 05/08/23 Yes [provider]  rosuvastatin (CRESTOR) 20 MG tablet Take 20 mg by mouth daily. 05/07/22  Yes [provider]  acetaminophen (TYLENOL) 500 MG tablet Take 2 tablets (1,000 mg total) by mouth every 6 (six) hours as needed. 05/23/16   Gloriann Loan, PA-C  albuterol (PROVENTIL HFA;VENTOLIN HFA) 108 (90 Base) MCG/ACT inhaler Inhale 2 puffs into the lungs every 6 (six) hours as needed for wheezing or shortness of breath. 06/15/17   Mannam, Hart Robinsons, MD  albuterol (VENTOLIN HFA) 108 (90 Base) MCG/ACT inhaler Inhale 2 puffs into the lungs every 6 (six) hours as needed for wheezing or shortness of breath.    [provider]  amLODipine (NORVASC) 5 MG tablet Take 5 mg by mouth daily.    [provider]  budesonide-formoterol (SYMBICORT) 160-4.5 MCG/ACT inhaler Inhale 2 puffs into the lungs 2 (two) times daily. 04/16/17   Mannam, Hart Robinsons, MD  chlorhexidine (PERIDEX) 0.12 % solution Use as directed 15 mLs in the mouth or throat 2 (two) times daily. 03/20/18  Jaynee Eagles, PA-C  chlorthalidone (HYGROTON) 25 MG tablet Take 25 mg by mouth daily.    [provider]  clopidogrel (PLAVIX) 75 MG tablet Take 75 mg by mouth daily.    [provider]  cyclobenzaprine (FLEXERIL) 10 MG tablet Take 1 tablet (10 mg total) by mouth 2 (two) times daily as needed for muscle spasms. 05/23/16   Gloriann Loan, PA-C  fluticasone furoate-vilanterol (BREO ELLIPTA) 200-25 MCG/INH AEPB Inhale 1 puff into the lungs daily. 04/16/17   Mannam, Hart Robinsons, MD  gabapentin (NEURONTIN) 300 MG capsule Take 1 capsule (300 mg total) by mouth 3 (three) times daily. 06/11/16   Marcial Pacas, MD   hydrOXYzine (ATARAX/VISTARIL) 25 MG tablet Take 1 tablet (25 mg total) by mouth every 6 (six) hours as needed for nausea (dizziness, insomnia). 02/17/20   Wieters, Hallie C, PA-C  Ipratropium-Albuterol (COMBIVENT RESPIMAT) 20-100 MCG/ACT AERS respimat Inhale 1 puff into the lungs every 6 (six) hours.    [provider]  methocarbamol (ROBAXIN) 500 MG tablet Take 1 tablet (500 mg total) by mouth every 8 (eight) hours as needed for muscle spasms. 09/04/17   Petrucelli, Samantha R, PA-C  naproxen (NAPROSYN) 500 MG tablet Take 1 tablet (500 mg total) by mouth 2 (two) times daily. 05/23/16   Gloriann Loan, PA-C  ondansetron (ZOFRAN ODT) 4 MG disintegrating tablet Take 1 tablet (4 mg total) by mouth every 8 (eight) hours as needed for nausea or vomiting. 02/17/20   Wieters, Hallie C, PA-C  polyvinyl alcohol (LIQUIFILM TEARS) 1.4 % ophthalmic solution Place 1 drop into both eyes daily as needed for dry eyes.    [provider]    Family History Family History  Problem Relation Age of Onset   Diabetes Mother    Hypertension Mother    Cancer Mother    Alcohol abuse Brother    Cancer Maternal Grandmother    Cancer Maternal Grandfather    Mental illness Sister     Social History Social History   Tobacco Use   Smoking status: Never   Smokeless tobacco: Never  Vaping Use   Vaping Use: Never used  Substance Use Topics   Alcohol use: No    Alcohol/week: 0.0 standard drinks of alcohol   Drug use: No     Allergies   Patient has no known allergies.   Review of Systems Review of Systems Per HPI  Physical Exam Triage Vital Signs ED Triage Vitals  Enc Vitals Group     BP 10/06/22 1309 (!) 147/101     Pulse Rate 10/06/22 1309 92     Resp 10/06/22 1309 18     Temp 10/06/22 1309 98.1 F (36.7 C)     Temp Source 10/06/22 1309 Oral     SpO2 10/06/22 1309 97 %     Weight --      Height --      Head Circumference --      Peak Flow --      Pain Score 10/06/22 1308 0      Pain Loc --      Pain Edu? --      Excl. in [place]? --    No data found.  Updated Vital Signs BP (!) 147/101 (BP Location: Left Arm)   Pulse 92   Temp 98.1 F (36.7 C) (Oral)   Resp 18   SpO2 97%   Visual Acuity Right Eye Distance:   Left Eye Distance:   Bilateral Distance:    Right Eye  Near:   Left Eye Near:    Bilateral Near:     Physical Exam Vitals and nursing note reviewed.  Constitutional:      Appearance: He is obese. He is not ill-appearing or toxic-appearing.  HENT:     Head: Normocephalic and atraumatic.     Right Ear: Hearing, tympanic membrane, ear canal and external ear normal.     Left Ear: Hearing, tympanic membrane, ear canal and external ear normal.     Nose: Congestion present.     Mouth/Throat:     Lips: Pink.     Mouth: Mucous membranes are moist. No injury.     Tongue: No lesions. Tongue does not deviate from midline.     Palate: No mass and lesions.     Pharynx: Oropharynx is clear. Uvula midline. No pharyngeal swelling, oropharyngeal exudate, posterior oropharyngeal erythema or uvula swelling.     Tonsils: No tonsillar exudate or tonsillar abscesses.  Eyes:     General: Lids are normal. Vision grossly intact. Gaze aligned appropriately.     Extraocular Movements: Extraocular movements intact.     Conjunctiva/sclera: Conjunctivae normal.  Cardiovascular:     Rate and Rhythm: Normal rate and regular rhythm.     Heart sounds: Normal heart sounds, S1 normal and S2 normal.  Pulmonary:     Effort: Pulmonary effort is normal. No respiratory distress.     Breath sounds: Normal breath sounds and air entry. No wheezing, rhonchi or rales.  Musculoskeletal:     Cervical back: Neck supple.  Lymphadenopathy:     Cervical: No cervical adenopathy.  Skin:    General: Skin is warm and dry.     Capillary Refill: Capillary refill takes less than 2 seconds.     Findings: No rash.  Neurological:     General: No focal deficit present.     Mental Status: He is  alert and oriented to person, place, and time. Mental status is at baseline.     Cranial Nerves: No dysarthria or facial asymmetry.  Psychiatric:        Mood and Affect: Mood normal.        Speech: Speech normal.        Behavior: Behavior normal.        Thought Content: Thought content normal.        Judgment: Judgment normal.      UC Treatments / Results  Labs (all labs ordered are listed, but only abnormal results are displayed) Labs Reviewed  SARS CORONAVIRUS 2 (TAT 6-24 HRS)    EKG   Radiology No results found.  Procedures Procedures (including critical care time)  Medications Ordered in UC Medications - No data to display  Initial Impression / Assessment and Plan / UC Course  I have reviewed the triage vital signs and the nursing notes.  Pertinent labs & imaging results that were available during my care of the patient were reviewed by me and considered in my medical decision making (see chart for details).   1.  Viral URI with cough Symptoms and physical exam consistent with a viral upper respiratory tract infection that will likely resolve with rest, fluids, and prescriptions for symptomatic relief. Deferred imaging based on stable cardiopulmonary exam and hemodynamically stable vital signs. COVID-10 testing is pending.  We will call patient if this is positive.  Quarantine guidelines discussed. Currently on day 3 of symptoms and does qualify for antiviral therapy.  He may have molnupiravir if he tests positive for COVID-19.  Tessalon Perles and guaifenesin sent to pharmacy for symptomatic relief to be taken as prescribed.  May continue taking over the counter medications as directed for further symptomatic relief.   Nonpharmacologic interventions for symptom relief provided and after visit summary below. Advised to push fluids to stay well hydrated while recovering from viral illness.   Discussed physical exam and available lab work findings in clinic with patient.   Counseled patient regarding appropriate use of medications and potential side effects for all medications recommended or prescribed today. Discussed red flag signs and symptoms of worsening condition,when to call the PCP office, return to urgent care, and when to seek higher level of care in the emergency department. Patient verbalizes understanding and agreement with plan. All questions answered. Patient discharged in stable condition.    Final Clinical Impressions(s) / UC Diagnoses   Final diagnoses:  Viral URI with cough     Discharge Instructions      You have a viral upper respiratory infection.  COVID-19 testing is pending. We will call you with results if positive. If your COVID test is positive, you must wear a mask for 5 days and go back to work once fever has been gone for 24 hours.  Use the following medicines to help with symptoms: - Plain Mucinex (guaifenesin) over the counter as directed every 12 hours to thin mucous so that you are able to get it out of your body easier. Drink plenty of water while taking this medication so that it works well in your body (at least 8 cups a day).  - Tylenol 1,000mg  and/or ibuprofen 600mg  every 6 hours with food as needed for aches/pains or fever/chills.  - Tessalon perles every 8 hours as needed for cough.  1 tablespoon of honey in warm water and/or salt water gargles may also help with symptoms. Humidifier to your room will help add water to the air and reduce coughing.  If you develop any new or worsening symptoms, please return.  If your symptoms are severe, please go to the emergency room.  Follow-up with your primary care provider for further evaluation and management of your symptoms as well as ongoing wellness visits.  I hope you feel better!    ED Prescriptions     Medication Sig Dispense Auth. Provider   Guaifenesin 1200 MG TB12 Take 1 tablet (1,200 mg total) by mouth in the morning and at bedtime. 14 tablet Joella Prince  M, FNP   benzonatate (TESSALON) 100 MG capsule Take 1 capsule (100 mg total) by mouth every 8 (eight) hours. 21 capsule Talbot Grumbling, FNP      PDMP not reviewed this encounter.   Talbot Grumbling, [place] 10/06/22 1423

## 2022-10-06 NOTE — ED Triage Notes (Signed)
Pt reports having congestion since Sunday. Reports coughing up phlegm that is clear. Took sinus medications OTC.

## 2022-10-06 NOTE — Discharge Instructions (Addendum)
You have a viral upper respiratory infection.  COVID-19 testing is pending. We will call you with results if positive. If your COVID test is positive, you must wear a mask for 5 days and go back to work once fever has been gone for 24 hours.  Use the following medicines to help with symptoms: - Plain Mucinex (guaifenesin) over the counter as directed every 12 hours to thin mucous so that you are able to get it out of your body easier. Drink plenty of water while taking this medication so that it works well in your body (at least 8 cups a day).  - Tylenol 1,000mg  and/or ibuprofen 600mg  every 6 hours with food as needed for aches/pains or fever/chills.  - Tessalon perles every 8 hours as needed for cough.  1 tablespoon of honey in warm water and/or salt water gargles may also help with symptoms. Humidifier to your room will help add water to the air and reduce coughing.  If you develop any new or worsening symptoms, please return.  If your symptoms are severe, please go to the emergency room.  Follow-up with your primary care provider for further evaluation and management of your symptoms as well as ongoing wellness visits.  I hope you feel better!

## 2022-10-07 ENCOUNTER — Telehealth (HOSPITAL_COMMUNITY): Payer: Self-pay | Admitting: Emergency Medicine

## 2022-10-07 LAB — SARS CORONAVIRUS 2 (TAT 6-24 HRS): SARS Coronavirus 2: POSITIVE — AB

## 2022-10-07 MED ORDER — MOLNUPIRAVIR EUA 200MG CAPSULE: 4.0000 | ORAL_CAPSULE | Freq: Two times a day (BID) | ORAL | 0 refills | Status: DC

## 2022-10-08 ENCOUNTER — Telehealth (HOSPITAL_COMMUNITY): Payer: Self-pay | Admitting: Emergency Medicine

## 2022-10-08 MED ORDER — MOLNUPIRAVIR EUA 200MG CAPSULE: 4.0000 | ORAL_CAPSULE | Freq: Two times a day (BID) | ORAL | 0 refills | Status: AC

## 2022-10-08 NOTE — Telephone Encounter (Signed)
Patient wanted prescription sent to the [facility]

## 2022-12-14 ENCOUNTER — Telehealth: Payer: Self-pay | Admitting: Family

## 2022-12-14 NOTE — Telephone Encounter (Signed)
Called Pt and left a voicemail to remind of scheduled New Patient appt. 

## 2022-12-22 NOTE — Progress Notes (Deleted)
  Subjective:    Reginald Collier - 57 y.o. male MRN [id number]  Date of birth: 02/23/1966  HPI  Reginald Collier is to establish care.   Current issues and/or concerns:  ROS per HPI     Health Maintenance:  Health Maintenance Due  Topic Date Due   Medicare Annual Wellness (AWV)  Never done   HIV Screening  Never done   Hepatitis C Screening  Never done   DTaP/Tdap/Td (1 - Tdap) Never done   Colonoscopy  Never done   Zoster Vaccines- Shingrix (1 of 2) Never done   COVID-19 Vaccine (4 - 2023-24 season) 03/13/2022     Past Medical History: Patient Active Problem List   Diagnosis Date Noted   Tinnitus 09/22/2017   Paresthesia 02/03/2017   Muscle spasm 02/03/2017   Facial muscle weakness 02/03/2017   Asthma 06/10/2016   Left-sided Bell's palsy 01/16/2016   Benign paroxysmal positional vertigo 06/17/2015   Pituitary adenoma (HCC) 06/17/2015   BMI 38.0-38.9,adult 05/27/2014   OSA (obstructive sleep apnea) 05/27/2014   Osteoarthritis of both knees 05/27/2014   Allergic rhinitis 10/12/2012      Social History   reports that he has never smoked. He has never used smokeless tobacco. He reports that he does not drink alcohol and does not use drugs.   Family History  family history includes Alcohol abuse in his brother; Cancer in his maternal grandfather, maternal grandmother, and mother; Diabetes in his mother; Hypertension in his mother; Mental illness in his sister.   Medications: reviewed and updated   Objective:   Physical Exam There were no vitals taken for this visit. Physical Exam      Assessment & Plan:         Patient was given clear instructions to go to Emergency Department or return to medical center if symptoms don't improve, worsen, or new problems develop.The patient verbalized understanding.  I discussed the assessment and treatment plan with the patient. The patient was provided an opportunity to ask questions and all were answered. The patient  agreed with the plan and demonstrated an understanding of the instructions.   The patient was advised to call back or seek an in-person evaluation if the symptoms worsen or if the condition fails to improve as anticipated.    Ricky Stabs, NP 12/22/2022, 3:52 PM Primary Care at [facility]

## 2022-12-23 ENCOUNTER — Ambulatory Visit: Payer: No Typology Code available for payment source | Admitting: Family

## 2023-04-15 ENCOUNTER — Ambulatory Visit: Payer: Medicare Other | Attending: Cardiology | Admitting: Cardiology

## 2023-04-15 VITALS — BP 132/88 | HR 83 | Ht 74.0 in | Wt 279.4 lb

## 2023-04-15 DIAGNOSIS — R072 Precordial pain: Secondary | ICD-10-CM | POA: Diagnosis not present

## 2023-04-15 DIAGNOSIS — I10 Essential (primary) hypertension: Secondary | ICD-10-CM

## 2023-04-15 DIAGNOSIS — R0609 Other forms of dyspnea: Secondary | ICD-10-CM

## 2023-04-15 DIAGNOSIS — G4733 Obstructive sleep apnea (adult) (pediatric): Secondary | ICD-10-CM

## 2023-04-15 DIAGNOSIS — Z01812 Encounter for preprocedural laboratory examination: Secondary | ICD-10-CM | POA: Diagnosis not present

## 2023-04-15 DIAGNOSIS — E782 Mixed hyperlipidemia: Secondary | ICD-10-CM

## 2023-04-15 DIAGNOSIS — Z7689 Persons encountering health services in other specified circumstances: Secondary | ICD-10-CM | POA: Diagnosis not present

## 2023-04-15 LAB — BASIC METABOLIC PANEL
BUN/Creatinine Ratio: 14 (ref 9–20)
BUN: 12 mg/dL (ref 6–24)
CO2: 24 mmol/L (ref 20–29)
Calcium: 9.8 mg/dL (ref 8.7–10.2)
Chloride: 99 mmol/L (ref 96–106)
Creatinine, Ser: 0.83 mg/dL (ref 0.76–1.27)
Glucose: 188 mg/dL — ABNORMAL HIGH (ref 70–99)
Potassium: 3.9 mmol/L (ref 3.5–5.2)
Sodium: 138 mmol/L (ref 134–144)
eGFR: 103 mL/min/{1.73_m2} (ref >59)

## 2023-04-15 LAB — MAGNESIUM: Magnesium: 2.1 mg/dL (ref 1.6–2.3)

## 2023-04-15 MED ORDER — METOPROLOL TARTRATE 100 MG PO TABS: ORAL_TABLET | ORAL | 0 refills | Status: DC

## 2023-04-15 NOTE — Patient Instructions (Addendum)
Medication Instructions:  Your physician recommends that you continue on your current medications as directed. Please refer to the Current Medication list given to you today.  *If you need a refill on your cardiac medications before your next appointment, please call your pharmacy*   Lab Work: TODAY: BMET, Mag If you have labs (blood work) drawn today and your tests are completely normal, you will receive your results only by: MyChart Message (if you have MyChart) OR A paper copy in the mail If you have any lab test that is abnormal or we need to change your treatment, we will call you to review the results.   Testing/Procedures: Your physician has requested that you have an echocardiogram. Echocardiography is a painless test that uses sound waves to create images of your heart. It provides your doctor with information about the size and shape of your heart and how well your heart's chambers and valves are working. This procedure takes approximately one hour. There are no restrictions for this procedure. Please do NOT wear cologne, perfume, aftershave, or lotions (deodorant is allowed). Please arrive 15 minutes prior to your appointment time.     Your cardiac CT will be scheduled at one of the below locations:   [facility] [address] [place], [place] [address] [phone]  If scheduled at [facility], please arrive at the [facility] and Children's Entrance (Entrance C2) of [facility] 30 minutes prior to test start time. You can use the FREE valet parking offered at entrance C (encouraged to control the heart rate for the test)  Proceed to the [facility] Radiology Department (first floor) to check-in and test prep.  All radiology patients and guests should use entrance C2 at [facility], accessed from [facility], even though the hospital's physical address listed is [address].     Please follow these instructions  carefully (unless otherwise directed):  An IV will be required for this test and Nitroglycerin will be given.  Hold all erectile dysfunction medications at least 3 days (72 hrs) prior to test. (Ie viagra, cialis, sildenafil, tadalafil, etc)   On the Night Before the Test: Be sure to Drink plenty of water. Do not consume any caffeinated/decaffeinated beverages or chocolate 12 hours prior to your test. Do not take any antihistamines 12 hours prior to your test.   On the Day of the Test: Drink plenty of water until 1 hour prior to the test. Do not eat any food 1 hour prior to test. You may take your regular medications prior to the test.  Take metoprolol (Lopressor) two hours prior to test. If you take Furosemide/Hydrochlorothiazide/Spironolactone, please HOLD on the morning of the test.        After the Test: Drink plenty of water. After receiving IV contrast, you may experience a mild flushed feeling. This is normal. On occasion, you may experience a mild rash up to 24 hours after the test. This is not dangerous. If this occurs, you can take Benadryl 25 mg and increase your fluid intake. If you experience trouble breathing, this can be serious. If it is severe call 911 IMMEDIATELY. If it is mild, please call our office. If you take any of these medications: Glipizide/Metformin, Avandament, Glucavance, please do not take 48 hours after completing test unless otherwise instructed.  We will call to schedule your test 2-4 weeks out understanding that some insurance companies will need an authorization prior to the service being performed.  For more information and frequently asked questions, please visit our website : [email or web address]  For non-scheduling related questions, please contact the cardiac imaging nurse navigator should you have any questions/concerns: Cardiac Imaging Nurse Navigators Direct Office Dial: [phone]   For scheduling needs, including  cancellations and rescheduling, please call [place], [phone].    Follow-Up: At [facility], you and your health needs are our priority.  As part of our continuing mission to provide you with exceptional heart care, we have created designated Provider Care Teams.  These Care Teams include your primary Cardiologist (physician) and Advanced Practice Providers (APPs -  Physician Assistants and Nurse Practitioners) who all work together to provide you with the care you need, when you need it.  Your next appointment:   16 week(s)  Provider:   Thomasene Ripple, DO

## 2023-04-15 NOTE — Progress Notes (Signed)
Cardiology Office Note:    Date:  04/15/2023   ID:  [name], [name] [birth year], MRN [id number]  PCP:  Sondra Come, MD  Cardiologist:  Thomasene Ripple, DO  Electrophysiologist:  None   Referring MD: Sondra Come, MD   " I am having shortness of breath"  History of Present Illness:    Reginald Collier is a 57 y.o. male with a hx of history of hypertension, diabetes, and sleep apnea managed with BIPAP, presents for a cardiac evaluation. He reports intermittent sleep disturbances despite BIPAP use, with episodes of waking up in the middle of the night and difficulty returning to sleep.   He also experiences occasional shortness of breath with exertion.He has had episodes of sharp, transient chest discomfort that feels like a 'shock' passing through in different directions.  He is concerned about this.  Shortness of breath is also bothersome.   He denies any history of cardiac catheterization or known heart blockages. He has a history of knee surgeries, appendectomy, and trigger finger surgery. He denies any personal history of smoking or drinking. His stepfather had a history of heart disease.  Past Medical History:  Diagnosis Date   Allergy    Asthma    Diabetes mellitus without complication (HCC)    Dizziness    Hypertension    TIA (transient ischemic attack)     Past Surgical History:  Procedure Laterality Date   APPENDECTOMY     HAND TENDON SURGERY     HERNIA REPAIR     KNEE SURGERY     scope on both knees    Current Medications: Current Meds  Medication Sig   acetaminophen (TYLENOL) 500 MG tablet Take 2 tablets (1,000 mg total) by mouth every 6 (six) hours as needed.   albuterol (PROVENTIL HFA;VENTOLIN HFA) 108 (90 Base) MCG/ACT inhaler Inhale 2 puffs into the lungs every 6 (six) hours as needed for wheezing or shortness of breath.   albuterol (VENTOLIN HFA) 108 (90 Base) MCG/ACT inhaler Inhale 2 puffs into the lungs every 6 (six) hours as needed for wheezing or shortness  of breath.   amLODipine (NORVASC) 5 MG tablet Take 5 mg by mouth daily.   benzonatate (TESSALON) 100 MG capsule Take 1 capsule (100 mg total) by mouth every 8 (eight) hours.   budesonide-formoterol (SYMBICORT) 160-4.5 MCG/ACT inhaler Inhale 2 puffs into the lungs 2 (two) times daily.   chlorhexidine (PERIDEX) 0.12 % solution Use as directed 15 mLs in the mouth or throat 2 (two) times daily.   chlorthalidone (HYGROTON) 25 MG tablet Take 25 mg by mouth daily.   clopidogrel (PLAVIX) 75 MG tablet Take 75 mg by mouth daily.   cyclobenzaprine (FLEXERIL) 10 MG tablet Take 1 tablet (10 mg total) by mouth 2 (two) times daily as needed for muscle spasms.   fluticasone furoate-vilanterol (BREO ELLIPTA) 200-25 MCG/INH AEPB Inhale 1 puff into the lungs daily.   gabapentin (NEURONTIN) 300 MG capsule Take 1 capsule (300 mg total) by mouth 3 (three) times daily.   Guaifenesin 1200 MG TB12 Take 1 tablet (1,200 mg total) by mouth in the morning and at bedtime.   hydrOXYzine (ATARAX/VISTARIL) 25 MG tablet Take 1 tablet (25 mg total) by mouth every 6 (six) hours as needed for nausea (dizziness, insomnia).   Ipratropium-Albuterol (COMBIVENT RESPIMAT) 20-100 MCG/ACT AERS respimat Inhale 1 puff into the lungs every 6 (six) hours.   JARDIANCE 25 MG TABS tablet Take 25 mg by mouth daily.   metFORMIN (GLUCOPHAGE-XR) 750 MG  24 hr tablet Take 750 mg by mouth in the morning and at bedtime.   methocarbamol (ROBAXIN) 500 MG tablet Take 1 tablet (500 mg total) by mouth every 8 (eight) hours as needed for muscle spasms.   metoprolol tartrate (LOPRESSOR) 100 MG tablet Take 2 hours prior to CT   naproxen (NAPROSYN) 500 MG tablet Take 1 tablet (500 mg total) by mouth 2 (two) times daily.   ondansetron (ZOFRAN ODT) 4 MG disintegrating tablet Take 1 tablet (4 mg total) by mouth every 8 (eight) hours as needed for nausea or vomiting.   polyvinyl alcohol (LIQUIFILM TEARS) 1.4 % ophthalmic solution Place 1 drop into both eyes daily as  needed for dry eyes.   rosuvastatin (CRESTOR) 20 MG tablet Take 20 mg by mouth daily.     Allergies:   Patient has no known allergies.   Social History   Socioeconomic History   Marital status: Single    Spouse name: Not on file   Number of children: 0   Years of education: BS   Highest education level: Not on file  Occupational History   Occupation: Transportation/Drives truck  Tobacco Use   Smoking status: Never   Smokeless tobacco: Never  Vaping Use   Vaping status: Never Used  Substance and Sexual Activity   Alcohol use: No    Alcohol/week: 0.0 standard drinks of alcohol   Drug use: No   Sexual activity: Not on file  Other Topics Concern   Not on file  Social History Narrative   Lives at home alone.   Left-handed.   No caffeine use.   Social Determinants of Health   Financial Resource Strain: Not on file  Food Insecurity: Not on file  Transportation Needs: Not on file  Physical Activity: Not on file  Stress: Not on file  Social Connections: Not on file     Family History: The patient's family history includes Alcohol abuse in his brother; Cancer in his maternal grandfather, maternal grandmother, and mother; Diabetes in his mother; Hypertension in his mother; Mental illness in his sister.  ROS:   Review of Systems  Constitution: Negative for decreased appetite, fever and weight gain.  HENT: Negative for congestion, ear discharge, hoarse voice and sore throat.   Eyes: Negative for discharge, redness, vision loss in right eye and visual halos.  Cardiovascular: Negative for chest pain, dyspnea on exertion, leg swelling, orthopnea and palpitations.  Respiratory: Negative for cough, hemoptysis, shortness of breath and snoring.   Endocrine: Negative for heat intolerance and polyphagia.  Hematologic/Lymphatic: Negative for bleeding problem. Does not bruise/bleed easily.  Skin: Negative for flushing, nail changes, rash and suspicious lesions.  Musculoskeletal:  Negative for arthritis, joint pain, muscle cramps, myalgias, neck pain and stiffness.  Gastrointestinal: Negative for abdominal pain, bowel incontinence, diarrhea and excessive appetite.  Genitourinary: Negative for decreased libido, genital sores and incomplete emptying.  Neurological: Negative for brief paralysis, focal weakness, headaches and loss of balance.  Psychiatric/Behavioral: Negative for altered mental status, depression and suicidal ideas.  Allergic/Immunologic: Negative for HIV exposure and persistent infections.    EKGs/Labs/Other Studies Reviewed:    The following studies were reviewed today:   EKG:  The ekg ordered today demonstrates sinus rhythm, heart rate 80 bpm with underlying incomplete right bundle branch block.  Recent Labs: No results found for requested labs within last 365 days.  Recent Lipid Panel No results found for: "CHOL", "TRIG", "HDL", "CHOLHDL", "VLDL", "LDLCALC", "LDLDIRECT"  Physical Exam:    VS:  BP  132/88   Pulse 83   Ht 6\' 2"  (1.88 m)   Wt 279 lb 6.4 oz (126.7 kg)   SpO2 98%   BMI 35.87 kg/m     Wt Readings from Last 3 Encounters:  04/15/23 279 lb 6.4 oz (126.7 kg)  09/22/17 289 lb 8 oz (131.3 kg)  06/15/17 296 lb (134.3 kg)     GEN: Well nourished, well developed in no acute distress HEENT: Normal NECK: No JVD; No carotid bruits LYMPHATICS: No lymphadenopathy CARDIAC: S1S2 noted,RRR, no murmurs, rubs, gallops RESPIRATORY:  Clear to auscultation without rales, wheezing or rhonchi  ABDOMEN: Soft, non-tender, non-distended, +bowel sounds, no guarding. EXTREMITIES: No edema, No cyanosis, no clubbing MUSCULOSKELETAL:  No deformity  SKIN: Warm and dry NEUROLOGIC:  Alert and oriented x 3, non-focal PSYCHIATRIC:  Normal affect, good insight  ASSESSMENT:    1. Encounter to establish care   2. Precordial pain   3. Pre-procedure lab exam   4. DOE (dyspnea on exertion)   5. Primary hypertension   6. Mixed hyperlipidemia   7. Morbid  obesity (HCC)   8. OSA treated with BiPAP    PLAN:    The symptoms chest pain is concerning, this patient does have intermediate risk for coronary artery disease and at this time I would like to pursue an ischemic evaluation in this patient.  Shared decision a coronary CTA at this time is appropriate.  I have discussed with the patient about the testing.  The patient has no IV contrast allergy and is agreeable to proceed with this test.  For shortness of breath hello study hypertension and OSA would like to get an echocardiogram to assess LV function as well as right side of his heart. The patient understands the need to lose weight with diet and exercise. We have discussed specific strategies for this.  Diabetes mellitus being managed by his primary team.  His OSA is being treated by BiPAP.  Blood pressure is at target no change in medication today.  Hyperlipidemia - continue with current statin medication.  The patient is in agreement with the above plan. The patient left the office in stable condition.  The patient will follow up in   Medication Adjustments/Labs and Tests Ordered: Current medicines are reviewed at length with the patient today.  Concerns regarding medicines are outlined above.  Orders Placed This Encounter  Procedures   CT CORONARY MORPH W/CTA COR W/SCORE W/CA W/CM &/OR WO/CM   Basic Metabolic Panel (BMET)   Magnesium   EKG 12-Lead   ECHOCARDIOGRAM COMPLETE   Meds ordered this encounter  Medications   metoprolol tartrate (LOPRESSOR) 100 MG tablet    Sig: Take 2 hours prior to CT    Dispense:  1 tablet    Refill:  0    Patient Instructions  Medication Instructions:  Your physician recommends that you continue on your current medications as directed. Please refer to the Current Medication list given to you today.  *If you need a refill on your cardiac medications before your next appointment, please call your pharmacy*   Lab Work: TODAY: BMET, Mag If  you have labs (blood work) drawn today and your tests are completely normal, you will receive your results only by: MyChart Message (if you have MyChart) OR A paper copy in the mail If you have any lab test that is abnormal or we need to change your treatment, we will call you to review the results.   Testing/Procedures: Your physician has requested that  you have an echocardiogram. Echocardiography is a painless test that uses sound waves to create images of your heart. It provides your doctor with information about the size and shape of your heart and how well your heart's chambers and valves are working. This procedure takes approximately one hour. There are no restrictions for this procedure. Please do NOT wear cologne, perfume, aftershave, or lotions (deodorant is allowed). Please arrive 15 minutes prior to your appointment time.     Your cardiac CT will be scheduled at one of the below locations:   [facility] [address] [place], [place] [address] [phone]  If scheduled at [facility], please arrive at the [facility] and Children's Entrance (Entrance C2) of [facility] 30 minutes prior to test start time. You can use the FREE valet parking offered at entrance C (encouraged to control the heart rate for the test)  Proceed to the [facility] Radiology Department (first floor) to check-in and test prep.  All radiology patients and guests should use entrance C2 at [facility], accessed from [facility], even though the hospital's physical address listed is [address].     Please follow these instructions carefully (unless otherwise directed):  An IV will be required for this test and Nitroglycerin will be given.  Hold all erectile dysfunction medications at least 3 days (72 hrs) prior to test. (Ie viagra, cialis, sildenafil, tadalafil, etc)   On the Night Before the Test: Be sure to Drink plenty of water. Do not  consume any caffeinated/decaffeinated beverages or chocolate 12 hours prior to your test. Do not take any antihistamines 12 hours prior to your test.   On the Day of the Test: Drink plenty of water until 1 hour prior to the test. Do not eat any food 1 hour prior to test. You may take your regular medications prior to the test.  Take metoprolol (Lopressor) two hours prior to test. If you take Furosemide/Hydrochlorothiazide/Spironolactone, please HOLD on the morning of the test.        After the Test: Drink plenty of water. After receiving IV contrast, you may experience a mild flushed feeling. This is normal. On occasion, you may experience a mild rash up to 24 hours after the test. This is not dangerous. If this occurs, you can take Benadryl 25 mg and increase your fluid intake. If you experience trouble breathing, this can be serious. If it is severe call 911 IMMEDIATELY. If it is mild, please call our office. If you take any of these medications: Glipizide/Metformin, Avandament, Glucavance, please do not take 48 hours after completing test unless otherwise instructed.  We will call to schedule your test 2-4 weeks out understanding that some insurance companies will need an authorization prior to the service being performed.   For more information and frequently asked questions, please visit our website : [email or web address]  For non-scheduling related questions, please contact the cardiac imaging nurse navigator should you have any questions/concerns: Cardiac Imaging Nurse Navigators Direct Office Dial: [phone]   For scheduling needs, including cancellations and rescheduling, please call [place], [phone].    Follow-Up: At [facility], you and your health needs are our priority.  As part of our continuing mission to provide you with exceptional heart care, we have created designated Provider Care Teams.  These Care Teams include your primary  Cardiologist (physician) and Advanced Practice Providers (APPs -  Physician Assistants and Nurse Practitioners) who all work together to provide  you with the care you need, when you need it.  Your next appointment:   16 week(s)  Provider:   Thomasene Ripple, DO     Adopting a Healthy Lifestyle.  Know what a healthy weight is for you (roughly BMI <25) and aim to maintain this   Aim for 7+ servings of fruits and vegetables daily   65-80+ fluid ounces of water or unsweet tea for healthy kidneys   Limit to max 1 drink of alcohol per day; avoid smoking/tobacco   Limit animal fats in diet for cholesterol and heart health - choose grass fed whenever available   Avoid highly processed foods, and foods high in saturated/trans fats   Aim for low stress - take time to unwind and care for your mental health   Aim for 150 min of moderate intensity exercise weekly for heart health, and weights twice weekly for bone health   Aim for 7-9 hours of sleep daily   When it comes to diets, agreement about the perfect plan isnt easy to find, even among the experts. Experts at the [facility] [employer] developed an idea known as the Healthy Eating Plate. Just imagine a plate divided into logical, healthy portions.   The emphasis is on diet quality:   Load up on vegetables and fruits - one-half of your plate: Aim for color and variety, and remember that potatoes dont count.   Go for whole grains - one-quarter of your plate: Whole wheat, barley, wheat berries, quinoa, oats, brown rice, and foods made with them. If you want pasta, go with whole wheat pasta.   Protein power - one-quarter of your plate: Fish, chicken, beans, and nuts are all healthy, versatile protein sources. Limit red meat.   The diet, however, does go beyond the plate, offering a few other suggestions.   Use healthy plant oils, such as olive, canola, soy, corn, sunflower and peanut. Check the labels, and avoid partially  hydrogenated oil, which have unhealthy trans fats.   If youre thirsty, drink water. Coffee and tea are good in moderation, but skip sugary drinks and limit milk and dairy products to one or two daily servings.   The type of carbohydrate in the diet is more important than the amount. Some sources of carbohydrates, such as vegetables, fruits, whole grains, and beans-are healthier than others.   Finally, stay active  Signed, Thomasene Ripple, DO  04/15/2023 11:03 AM    [facility]

## 2023-04-20 ENCOUNTER — Encounter (HOSPITAL_COMMUNITY): Payer: Self-pay

## 2023-04-21 ENCOUNTER — Telehealth (HOSPITAL_COMMUNITY): Payer: Self-pay | Admitting: *Deleted

## 2023-04-21 NOTE — Telephone Encounter (Signed)
Reaching out to patient to offer assistance regarding upcoming cardiac imaging study; pt verbalizes understanding of appt date/time, parking situation and where to check in, pre-test NPO status and medications ordered, and verified current allergies; name and call back number provided for further questions should they arise  Larey Brick RN Navigator Cardiac Imaging [facility] [phone] office [phone] cell  Patient to take 100mg  metoprolol tartrate two hours prior to his cardiac CT scan.  He is aware to arrive at 3:30 PM.

## 2023-04-23 ENCOUNTER — Ambulatory Visit (HOSPITAL_COMMUNITY): Admission: RE | Admit: 2023-04-23 | Payer: Medicare Other | Source: Ambulatory Visit

## 2023-05-06 ENCOUNTER — Ambulatory Visit (HOSPITAL_COMMUNITY): Payer: Medicare Other | Attending: Cardiology

## 2023-05-25 ENCOUNTER — Other Ambulatory Visit (HOSPITAL_COMMUNITY): Payer: Medicare Other

## 2023-06-14 ENCOUNTER — Telehealth (HOSPITAL_COMMUNITY): Payer: Self-pay | Admitting: *Deleted

## 2023-06-14 NOTE — Telephone Encounter (Signed)
Reaching out to patient to offer assistance regarding upcoming cardiac imaging study; pt verbalizes understanding of appt date/time, parking situation and where to check in, pre-test NPO status and medications ordered, and verified current allergies; name and call back number provided for further questions should they arise Hayley Sharpe RN Navigator Cardiac Imaging [facility] [phone] office [phone] cell  

## 2023-06-17 ENCOUNTER — Ambulatory Visit (HOSPITAL_COMMUNITY): Admission: RE | Admit: 2023-06-17 | Payer: Medicare Other | Source: Ambulatory Visit

## 2023-06-18 ENCOUNTER — Ambulatory Visit (HOSPITAL_COMMUNITY): Payer: Medicare Other | Attending: Cardiology

## 2023-06-18 DIAGNOSIS — R072 Precordial pain: Secondary | ICD-10-CM | POA: Insufficient documentation

## 2023-06-18 DIAGNOSIS — R0609 Other forms of dyspnea: Secondary | ICD-10-CM | POA: Diagnosis not present

## 2023-06-18 LAB — ECHOCARDIOGRAM COMPLETE
Area-P 1/2: 3.72 cm2
S' Lateral: 3.4 cm

## 2023-08-06 ENCOUNTER — Encounter: Payer: Self-pay | Admitting: Cardiology

## 2023-08-06 ENCOUNTER — Ambulatory Visit: Payer: Medicare Other | Attending: Cardiology | Admitting: Cardiology

## 2023-08-06 VITALS — BP 120/80 | HR 78 | Ht 74.0 in | Wt 287.0 lb

## 2023-08-06 DIAGNOSIS — G4733 Obstructive sleep apnea (adult) (pediatric): Secondary | ICD-10-CM | POA: Diagnosis not present

## 2023-08-06 DIAGNOSIS — R079 Chest pain, unspecified: Secondary | ICD-10-CM

## 2023-08-06 DIAGNOSIS — R072 Precordial pain: Secondary | ICD-10-CM | POA: Diagnosis not present

## 2023-08-06 DIAGNOSIS — Z01812 Encounter for preprocedural laboratory examination: Secondary | ICD-10-CM | POA: Diagnosis not present

## 2023-08-06 MED ORDER — METOPROLOL TARTRATE 100 MG PO TABS: ORAL_TABLET | ORAL | 0 refills | Status: DC

## 2023-08-06 NOTE — Progress Notes (Signed)
Cardiology Office Note:    Date:  08/06/2023   ID:  Borden, Thune 02/26/1966, MRN [id number]  PCP:  Sondra Come, MD  Cardiologist:  Thomasene Ripple, DO  Electrophysiologist:  None   Referring MD: Sondra Come, MD   " I am still having chest pain"  History of Present Illness:    Reginald Collier is a 58 y.o. male with a hx of hypertension, diabetes, and sleep apnea managed with BIPAP, and TIA.   He is here for a follow up visit. At his last visit her was experiencing chest pain. I send the patient for a CCTA but he did not get the CCTA he had some reservation.  He got his echo which was normal.  He is still having chest pain - and wants to get answer.   Past Medical History:  Diagnosis Date   Allergy    Asthma    Diabetes mellitus without complication (HCC)    Dizziness    Hypertension    TIA (transient ischemic attack)     Past Surgical History:  Procedure Laterality Date   APPENDECTOMY     HAND TENDON SURGERY     HERNIA REPAIR     KNEE SURGERY     scope on both knees    Current Medications: Current Meds  Medication Sig   acetaminophen (TYLENOL) 500 MG tablet Take 2 tablets (1,000 mg total) by mouth every 6 (six) hours as needed.   albuterol (PROVENTIL HFA;VENTOLIN HFA) 108 (90 Base) MCG/ACT inhaler Inhale 2 puffs into the lungs every 6 (six) hours as needed for wheezing or shortness of breath.   albuterol (VENTOLIN HFA) 108 (90 Base) MCG/ACT inhaler Inhale 2 puffs into the lungs every 6 (six) hours as needed for wheezing or shortness of breath.   amLODipine (NORVASC) 5 MG tablet Take 5 mg by mouth daily.   budesonide-formoterol (SYMBICORT) 160-4.5 MCG/ACT inhaler Inhale 2 puffs into the lungs 2 (two) times daily.   chlorhexidine (PERIDEX) 0.12 % solution Use as directed 15 mLs in the mouth or throat 2 (two) times daily.   chlorthalidone (HYGROTON) 25 MG tablet Take 25 mg by mouth daily.   clopidogrel (PLAVIX) 75 MG tablet Take 75 mg by mouth daily.    cyclobenzaprine (FLEXERIL) 10 MG tablet Take 1 tablet (10 mg total) by mouth 2 (two) times daily as needed for muscle spasms.   fluticasone furoate-vilanterol (BREO ELLIPTA) 200-25 MCG/INH AEPB Inhale 1 puff into the lungs daily.   gabapentin (NEURONTIN) 300 MG capsule Take 1 capsule (300 mg total) by mouth 3 (three) times daily.   hydrOXYzine (ATARAX/VISTARIL) 25 MG tablet Take 1 tablet (25 mg total) by mouth every 6 (six) hours as needed for nausea (dizziness, insomnia).   Ipratropium-Albuterol (COMBIVENT RESPIMAT) 20-100 MCG/ACT AERS respimat Inhale 1 puff into the lungs every 6 (six) hours.   metFORMIN (GLUCOPHAGE-XR) 750 MG 24 hr tablet Take 750 mg by mouth in the morning and at bedtime.   methocarbamol (ROBAXIN) 500 MG tablet Take 1 tablet (500 mg total) by mouth every 8 (eight) hours as needed for muscle spasms.   naproxen (NAPROSYN) 500 MG tablet Take 1 tablet (500 mg total) by mouth 2 (two) times daily.   ondansetron (ZOFRAN ODT) 4 MG disintegrating tablet Take 1 tablet (4 mg total) by mouth every 8 (eight) hours as needed for nausea or vomiting.   polyvinyl alcohol (LIQUIFILM TEARS) 1.4 % ophthalmic solution Place 1 drop into both eyes daily as needed  for dry eyes.   rosuvastatin (CRESTOR) 20 MG tablet Take 20 mg by mouth daily.     Allergies:   Patient has no known allergies.   Social History   Socioeconomic History   Marital status: Single    Spouse name: Not on file   Number of children: 0   Years of education: BS   Highest education level: Not on file  Occupational History   Occupation: Transportation/Drives truck  Tobacco Use   Smoking status: Never   Smokeless tobacco: Never  Vaping Use   Vaping status: Never Used  Substance and Sexual Activity   Alcohol use: No    Alcohol/week: 0.0 standard drinks of alcohol   Drug use: No   Sexual activity: Not on file  Other Topics Concern   Not on file  Social History Narrative   Lives at home alone.   Left-handed.   No  caffeine use.   Social Drivers of Corporate investment banker Strain: Not on file  Food Insecurity: Not on file  Transportation Needs: Not on file  Physical Activity: Not on file  Stress: Not on file  Social Connections: Not on file     Family History: The patient's family history includes Alcohol abuse in his brother; Cancer in his maternal grandfather, maternal grandmother, and mother; Diabetes in his mother; Hypertension in his mother; Mental illness in his sister.  ROS:   Review of Systems  Constitution: Negative for decreased appetite, fever and weight gain.  HENT: Negative for congestion, ear discharge, hoarse voice and sore throat.   Eyes: Negative for discharge, redness, vision loss in right eye and visual halos.  Cardiovascular: Negative for chest pain, dyspnea on exertion, leg swelling, orthopnea and palpitations.  Respiratory: Negative for cough, hemoptysis, shortness of breath and snoring.   Endocrine: Negative for heat intolerance and polyphagia.  Hematologic/Lymphatic: Negative for bleeding problem. Does not bruise/bleed easily.  Skin: Negative for flushing, nail changes, rash and suspicious lesions.  Musculoskeletal: Negative for arthritis, joint pain, muscle cramps, myalgias, neck pain and stiffness.  Gastrointestinal: Negative for abdominal pain, bowel incontinence, diarrhea and excessive appetite.  Genitourinary: Negative for decreased libido, genital sores and incomplete emptying.  Neurological: Negative for brief paralysis, focal weakness, headaches and loss of balance.  Psychiatric/Behavioral: Negative for altered mental status, depression and suicidal ideas.  Allergic/Immunologic: Negative for HIV exposure and persistent infections.    EKGs/Labs/Other Studies Reviewed:    The following studies were reviewed today:   EKG:  The ekg ordered today demonstrates   Recent Labs: 04/15/2023: BUN 12; Creatinine, Ser 0.83; Magnesium 2.1; Potassium 3.9; Sodium 138   Recent Lipid Panel No results found for: "CHOL", "TRIG", "HDL", "CHOLHDL", "VLDL", "LDLCALC", "LDLDIRECT"  Physical Exam:    VS:  BP 120/80 (BP Location: Right Arm, Patient Position: Sitting, Cuff Size: Normal)   Pulse 78   Ht 6\' 2"  (1.88 m)   Wt 287 lb (130.2 kg)   SpO2 98%   BMI 36.85 kg/m     Wt Readings from Last 3 Encounters:  08/06/23 287 lb (130.2 kg)  04/15/23 279 lb 6.4 oz (126.7 kg)  09/22/17 289 lb 8 oz (131.3 kg)     GEN: Well nourished, well developed in no acute distress HEENT: Normal NECK: No JVD; No carotid bruits LYMPHATICS: No lymphadenopathy CARDIAC: S1S2 noted,RRR, no murmurs, rubs, gallops RESPIRATORY:  Clear to auscultation without rales, wheezing or rhonchi  ABDOMEN: Soft, non-tender, non-distended, +bowel sounds, no guarding. EXTREMITIES: No edema, No cyanosis, no clubbing  MUSCULOSKELETAL:  No deformity  SKIN: Warm and dry NEUROLOGIC:  Alert and oriented x 3, non-focal PSYCHIATRIC:  Normal affect, good insight  ASSESSMENT:    1. Pre-procedure lab exam   2. Chest pain of uncertain etiology   3. Precordial pain   4. OSA treated with BiPAP   5. Morbid obesity (HCC)    PLAN:    He is still having chest pain and wants to get answers. He did not get the CCTA . He needs it. His symptoms of chest pain is concerning, this patient does have intermediate risk for coronary artery disease and at this time I would like to pursue an ischemic evaluation in this patient. Shared decision a coronary CTA at this time is appropriate. I have discussed with the patient about the testing. The patient has no IV contrast allergy and is agreeable to proceed with this test.   Diabetes mellitus being managed by his primary team.   His OSA is being treated by BiPAP.   Blood pressure is at target no change in medication today.   Hyperlipidemia - continue with current statin medication.  The patient is in agreement with the above plan. The patient left the office in  stable condition.  The patient will follow up in   Medication Adjustments/Labs and Tests Ordered: Current medicines are reviewed at length with the patient today.  Concerns regarding medicines are outlined above.  Orders Placed This Encounter  Procedures   Comprehensive Metabolic Panel (CMET)   Magnesium   Meds ordered this encounter  Medications   metoprolol tartrate (LOPRESSOR) 100 MG tablet    Sig: Take 2 hours prior to CT    Dispense:  1 tablet    Refill:  0    Patient Instructions  Medication Instructions:  No changes *If you need a refill on your cardiac medications before your next appointment, please call your pharmacy*   Lab Work:  Your physician recommends that you have lab work today If you have labs (blood work) drawn today and your tests are completely normal, you will receive your results only by: MyChart Message (if you have MyChart) OR A paper copy in the mail If you have any lab test that is abnormal or we need to change your treatment, we will call you to review the results.   Testing/Procedures:    Your cardiac CT will be scheduled at   [facility] [address] [place], [place] [address] [phone]   If scheduled at [facility], please arrive at the [facility] and Children's Entrance (Entrance C2) of [facility] 30 minutes prior to test start time. You can use the FREE valet parking offered at entrance C (encouraged to control the heart rate for the test)  Proceed to the [facility] Radiology Department (first floor) to check-in and test prep.  All radiology patients and guests should use entrance C2 at [facility], accessed from [facility], even though the hospital's physical address listed is [address].    If scheduled at [facility] or [facility], please arrive 15 mins early for check-in and test prep.   Please follow these  instructions carefully (unless otherwise directed):  An IV will be required for this test and Nitroglycerin will be given.  Hold all erectile dysfunction medications at least 3 days (72 hrs) prior to test. (Ie viagra, cialis, sildenafil, tadalafil, etc)   On the Night Before the Test: Be sure  to Drink plenty of water. Do not consume any caffeinated/decaffeinated beverages or chocolate 12 hours prior to your test. Do not take any antihistamines 12 hours prior to your test.  On the Day of the Test: Drink plenty of water until 1 hour prior to the test. Do not eat any food 1 hour prior to test. You may take your regular medications prior to the test.  Take metoprolol (Lopressor) 100 mg two hours prior to test. Patients who wear a continuous glucose monitor MUST remove the device prior to scanning.      After the Test: Drink plenty of water. After receiving IV contrast, you may experience a mild flushed feeling. This is normal. On occasion, you may experience a mild rash up to 24 hours after the test. This is not dangerous. If this occurs, you can take Benadryl 25 mg and increase your fluid intake. If you experience trouble breathing, this can be serious. If it is severe call 911 IMMEDIATELY. If it is mild, please call our office.  We will call to schedule your test 2-4 weeks out understanding that some insurance companies will need an authorization prior to the service being performed.   For more information and frequently asked questions, please visit our website : [email or web address]  For non-scheduling related questions, please contact the cardiac imaging nurse navigator should you have any questions/concerns: Cardiac Imaging Nurse Navigators Direct Office Dial: [phone]   For scheduling needs, including cancellations and rescheduling, please call [place], [phone].     Follow-Up: At [facility], you and your health needs are our priority.  As part  of our continuing mission to provide you with exceptional heart care, we have created designated Provider Care Teams.  These Care Teams include your primary Cardiologist (physician) and Advanced Practice Providers (APPs -  Physician Assistants and Nurse Practitioners) who all work together to provide you with the care you need, when you need it.  Your next appointment:   6 month(s)  Provider:   Thomasene Ripple, DO     Other Instructions:     Adopting a Healthy Lifestyle.  Know what a healthy weight is for you (roughly BMI <25) and aim to maintain this   Aim for 7+ servings of fruits and vegetables daily   65-80+ fluid ounces of water or unsweet tea for healthy kidneys   Limit to max 1 drink of alcohol per day; avoid smoking/tobacco   Limit animal fats in diet for cholesterol and heart health - choose grass fed whenever available   Avoid highly processed foods, and foods high in saturated/trans fats   Aim for low stress - take time to unwind and care for your mental health   Aim for 150 min of moderate intensity exercise weekly for heart health, and weights twice weekly for bone health   Aim for 7-9 hours of sleep daily   When it comes to diets, agreement about the perfect plan isnt easy to find, even among the experts. Experts at the [facility] of [employer] developed an idea known as the Healthy Eating Plate. Just imagine a plate divided into logical, healthy portions.   The emphasis is on diet quality:   Load up on vegetables and fruits - one-half of your plate: Aim for color and variety, and remember that potatoes dont count.   Go for whole grains - one-quarter of your plate: Whole wheat, barley, wheat berries, quinoa, oats, brown rice, and foods made with them. If you want pasta, go with  whole wheat pasta.   Protein power - one-quarter of your plate: Fish, chicken, beans, and nuts are all healthy, versatile protein sources. Limit red meat.   The diet, however, does  go beyond the plate, offering a few other suggestions.   Use healthy plant oils, such as olive, canola, soy, corn, sunflower and peanut. Check the labels, and avoid partially hydrogenated oil, which have unhealthy trans fats.   If youre thirsty, drink water. Coffee and tea are good in moderation, but skip sugary drinks and limit milk and dairy products to one or two daily servings.   The type of carbohydrate in the diet is more important than the amount. Some sources of carbohydrates, such as vegetables, fruits, whole grains, and beans-are healthier than others.   Finally, stay active  Signed, Thomasene Ripple, DO  08/06/2023 9:03 PM    [facility]   Your cardiac CT will be scheduled at one of the below locations:   [facility] [address] [place], [place] [address] [phone]  OR  [facility] [address] [place], [place] [address] [phone]  OR   [facility] [address] [place], [place] [address] [phone]  OR   [facility] [address] [place], [place] [address] [phone]  If scheduled at [facility], please arrive at the [facility] and Children's Entrance (Entrance C2) of [facility] 30 minutes prior to test start time. You can use the FREE valet parking offered at entrance C (encouraged to control the heart rate for the test)  Proceed to the [facility] Radiology Department (first floor) to check-in and test prep.  All radiology patients and guests should use entrance C2 at [facility], accessed from [facility], even though the hospital's physical address listed is [address]    If scheduled at [facility] or [facility], please arrive 15 mins early for check-in and test prep.  There is spacious parking and easy access to the  radiology department from the [facility] entrance. Please enter here and check-in with the desk attendant.   Please follow these instructions carefully (unless otherwise directed):  An IV will be required for this test and Nitroglycerin will be given.  Hold all erectile dysfunction medications at least 3 days (72 hrs) prior to test. (Ie viagra, cialis, sildenafil, tadalafil, etc)   On the Night Before the Test: Be sure to Drink plenty of water. Do not consume any caffeinated/decaffeinated beverages or chocolate 12 hours prior to your test. Do not take any antihistamines 12 hours prior to your test. If the patient has contrast allergy: Patient will need a prescription for Prednisone and very clear instructions (as follows): Prednisone 50 mg - take 13 hours prior to test Take another Prednisone 50 mg 7 hours prior to test Take another Prednisone 50 mg 1 hour prior to test Take Benadryl 50 mg 1 hour prior to test Patient must complete all four doses of above prophylactic medications. Patient will need a ride after test due to Benadryl.  On the Day of the Test: Drink plenty of water until 1 hour prior to the test. Do not eat any food 1 hour prior to test. You may take your regular medications prior to the test.  Take metoprolol (Lopressor) two hours prior to test. If you take Furosemide/Hydrochlorothiazide/Spironolactone/Chlorthalidone, please HOLD on the morning of  the test. Patients who wear a continuous glucose monitor MUST remove the device prior to scanning. FEMALES- please wear underwire-free bra if available, avoid dresses & tight clothing  *For Clinical Staff only. Please instruct patient the following:* Heart Rate Medication Recommendations for Cardiac CT  Resting HR < 50 bpm  No medication  Resting HR 50-60 bpm and BP >110/50 mmHG   Consider Metoprolol tartrate 25 mg PO 90-120 min prior to scan  Resting HR 60-65 bpm and BP >110/50 mmHG  Metoprolol tartrate 50 mg  PO 90-120 minutes prior to scan   Resting HR > 65 bpm and BP >110/50 mmHG  Metoprolol tartrate 100 mg PO 90-120 minutes prior to scan  Consider Ivabradine 10-15 mg PO or a calcium channel blocker for resting HR >60 bpm and contraindication to metoprolol tartrate  Consider Ivabradine 10-15 mg PO in combination with metoprolol tartrate for HR >80 bpm        After the Test: Drink plenty of water. After receiving IV contrast, you may experience a mild flushed feeling. This is normal. On occasion, you may experience a mild rash up to 24 hours after the test. This is not dangerous. If this occurs, you can take Benadryl 25 mg and increase your fluid intake. If you experience trouble breathing, this can be serious. If it is severe call 911 IMMEDIATELY. If it is mild, please call our office.  We will call to schedule your test 2-4 weeks out understanding that some insurance companies will need an authorization prior to the service being performed.   For more information and frequently asked questions, please visit our website : [email or web address]  For non-scheduling related questions, please contact the cardiac imaging nurse navigator should you have any questions/concerns: Cardiac Imaging Nurse Navigators Direct Office Dial: [phone]   For scheduling needs, including cancellations and rescheduling, please call [place], [phone].

## 2023-08-06 NOTE — Patient Instructions (Signed)
Medication Instructions:  No changes *If you need a refill on your cardiac medications before your next appointment, please call your pharmacy*   Lab Work:  Your physician recommends that you have lab work today If you have labs (blood work) drawn today and your tests are completely normal, you will receive your results only by: MyChart Message (if you have MyChart) OR A paper copy in the mail If you have any lab test that is abnormal or we need to change your treatment, we will call you to review the results.   Testing/Procedures:    Your cardiac CT will be scheduled at   [facility] [address] [place], [place] [address] [phone]   If scheduled at [facility], please arrive at the [facility] and Children's Entrance (Entrance C2) of [facility] 30 minutes prior to test start time. You can use the FREE valet parking offered at entrance C (encouraged to control the heart rate for the test)  Proceed to the [facility] Radiology Department (first floor) to check-in and test prep.  All radiology patients and guests should use entrance C2 at [facility], accessed from [facility], even though the hospital's physical address listed is [address]    If scheduled at [facility] or [facility], please arrive 15 mins early for check-in and test prep.   Please follow these instructions carefully (unless otherwise directed):  An IV will be required for this test and Nitroglycerin will be given.  Hold all erectile dysfunction medications at least 3 days (72 hrs) prior to test. (Ie viagra, cialis, sildenafil, tadalafil, etc)   On the Night Before the Test: Be sure to Drink plenty of water. Do not consume any caffeinated/decaffeinated beverages or chocolate 12 hours prior to your test. Do not take any antihistamines 12 hours prior to your test.  On the Day of the  Test: Drink plenty of water until 1 hour prior to the test. Do not eat any food 1 hour prior to test. You may take your regular medications prior to the test.  Take metoprolol (Lopressor) 100 mg two hours prior to test. Patients who wear a continuous glucose monitor MUST remove the device prior to scanning.      After the Test: Drink plenty of water. After receiving IV contrast, you may experience a mild flushed feeling. This is normal. On occasion, you may experience a mild rash up to 24 hours after the test. This is not dangerous. If this occurs, you can take Benadryl 25 mg and increase your fluid intake. If you experience trouble breathing, this can be serious. If it is severe call 911 IMMEDIATELY. If it is mild, please call our office.  We will call to schedule your test 2-4 weeks out understanding that some insurance companies will need an authorization prior to the service being performed.   For more information and frequently asked questions, please visit our website : [email or web address]  For non-scheduling related questions, please contact the cardiac imaging nurse navigator should you have any questions/concerns: Cardiac Imaging Nurse Navigators Direct Office Dial: [phone]   For scheduling needs, including cancellations and rescheduling, please call [place], [phone].     Follow-Up: At [facility], you and your health needs are our priority.  As part of our continuing mission to provide you with exceptional heart care, we have created designated Provider Care Teams.  These Care Teams include your primary Cardiologist (physician) and  Advanced Practice Providers (APPs -  Physician Assistants and Nurse Practitioners) who all work together to provide you with the care you need, when you need it.  Your next appointment:   6 month(s)  Provider:   Thomasene Ripple, DO     Other Instructions:

## 2023-08-07 LAB — COMPREHENSIVE METABOLIC PANEL
ALT: 37 [IU]/L (ref 0–44)
AST: 17 [IU]/L (ref 0–40)
Albumin: 4.3 g/dL (ref 3.8–4.9)
Alkaline Phosphatase: 67 [IU]/L (ref 44–121)
BUN/Creatinine Ratio: 14 (ref 9–20)
BUN: 12 mg/dL (ref 6–24)
Bilirubin Total: 0.4 mg/dL (ref 0.0–1.2)
CO2: 24 mmol/L (ref 20–29)
Calcium: 9.7 mg/dL (ref 8.7–10.2)
Chloride: 101 mmol/L (ref 96–106)
Creatinine, Ser: 0.85 mg/dL (ref 0.76–1.27)
Globulin, Total: 2.7 g/dL (ref 1.5–4.5)
Glucose: 197 mg/dL — ABNORMAL HIGH (ref 70–99)
Potassium: 4.1 mmol/L (ref 3.5–5.2)
Sodium: 139 mmol/L (ref 134–144)
Total Protein: 7 g/dL (ref 6.0–8.5)
eGFR: 101 mL/min/{1.73_m2} (ref >59)

## 2023-08-07 LAB — MAGNESIUM: Magnesium: 1.9 mg/dL (ref 1.6–2.3)

## 2023-08-08 ENCOUNTER — Encounter: Payer: Self-pay | Admitting: Cardiology

## 2023-08-23 ENCOUNTER — Other Ambulatory Visit: Payer: Self-pay

## 2023-08-23 DIAGNOSIS — R072 Precordial pain: Secondary | ICD-10-CM

## 2023-09-06 ENCOUNTER — Other Ambulatory Visit (HOSPITAL_COMMUNITY): Payer: Self-pay | Admitting: *Deleted

## 2023-09-06 ENCOUNTER — Encounter (HOSPITAL_COMMUNITY): Payer: Self-pay

## 2023-09-06 MED ORDER — METOPROLOL TARTRATE 100 MG PO TABS: ORAL_TABLET | ORAL | 0 refills | Status: DC

## 2023-09-09 ENCOUNTER — Ambulatory Visit (HOSPITAL_COMMUNITY)
Admission: RE | Admit: 2023-09-09 | Discharge: 2023-09-09 | Disposition: A | Discharge status: Home / Self Care | Payer: Medicare Other | Source: Ambulatory Visit | Attending: Cardiology | Admitting: Cardiology

## 2023-09-09 DIAGNOSIS — R072 Precordial pain: Secondary | ICD-10-CM | POA: Diagnosis present

## 2023-09-09 MED ORDER — DILTIAZEM HCL 25 MG/5ML IV SOLN
10.0000 mg | INTRAVENOUS | Status: DC | PRN
  Administered 2023-09-09: 10 mg via INTRAVENOUS

## 2023-09-09 MED ORDER — IOHEXOL 350 MG/ML SOLN
110.0000 mL | Freq: Once | INTRAVENOUS | Status: AC | PRN
  Administered 2023-09-09: 110 mL via INTRAVENOUS

## 2023-09-09 MED ORDER — DILTIAZEM HCL 25 MG/5ML IV SOLN
INTRAVENOUS | Status: AC
  Filled 2023-09-09: qty 5

## 2023-09-09 MED ORDER — NITROGLYCERIN 0.4 MG SL SUBL
0.8000 mg | SUBLINGUAL_TABLET | Freq: Once | SUBLINGUAL | Status: AC
  Administered 2023-09-09: 0.8 mg via SUBLINGUAL

## 2023-09-09 MED ORDER — NITROGLYCERIN 0.4 MG SL SUBL
SUBLINGUAL_TABLET | SUBLINGUAL | Status: AC
  Filled 2023-09-09: qty 2

## 2023-09-09 MED ORDER — DILTIAZEM HCL 25 MG/5ML IV SOLN
20.0000 mg | INTRAVENOUS | Status: AC | PRN
  Administered 2023-09-09: 10 mg via INTRAVENOUS

## 2023-09-15 ENCOUNTER — Telehealth: Payer: Self-pay | Admitting: Cardiology

## 2023-09-15 NOTE — Telephone Encounter (Signed)
 Patient identification verified by 2 forms. Marilynn Rail, RN    Called and spoke to patient  Patient states:   -received call from hospital stating CT was fuzzy and would need to repeat   -advised hospital to send results to Dr. Servando Salina   -would like recent results  Informed patient:  -CT results not available at this time   -RN will outreach radiology for updates on results  Patient verbalized understanding, no questions at this time  ___________________________________________________________________  Jeanene Erb and spoke to [facility] with [facility]  Tracey states:   -results have not been read   -unsure who contacted patient   ______________________________________________________________________ Patient identification verified by 2 forms. Marilynn Rail, RN    Called and spoke to patient  Informed patient:   -per radiology results will be read   -can take a few days to 1 week for provider to review results   -he will be outreached with Dr. Servando Salina interpretations  Patient verbalized understanding, no questions at this time

## 2023-09-15 NOTE — Telephone Encounter (Signed)
 New Message:       Patient would like for Dr Mallory Shirk nurse to please give him a call. He says it is concerning the test that he had on Thursday(09-09-23)

## 2023-09-16 ENCOUNTER — Telehealth: Payer: Self-pay | Admitting: Cardiology

## 2023-09-16 NOTE — Telephone Encounter (Signed)
 Pt is calling to get CT Scan Results

## 2023-09-23 ENCOUNTER — Encounter (HOSPITAL_COMMUNITY): Payer: Self-pay

## 2023-09-23 ENCOUNTER — Ambulatory Visit (HOSPITAL_COMMUNITY)
Admission: EM | Admit: 2023-09-23 | Discharge: 2023-09-23 | Disposition: A | Discharge status: Home / Self Care | Attending: Internal Medicine | Admitting: Internal Medicine

## 2023-09-23 DIAGNOSIS — R110 Nausea: Secondary | ICD-10-CM | POA: Diagnosis not present

## 2023-09-23 DIAGNOSIS — A059 Bacterial foodborne intoxication, unspecified: Secondary | ICD-10-CM | POA: Diagnosis not present

## 2023-09-23 MED ORDER — ONDANSETRON 4 MG PO TBDP
4.0000 mg | ORAL_TABLET | Freq: Once | ORAL | Status: AC
  Administered 2023-09-23: 4 mg via ORAL

## 2023-09-23 MED ORDER — ONDANSETRON 4 MG PO TBDP
ORAL_TABLET | ORAL | Status: AC
  Filled 2023-09-23: qty 1

## 2023-09-23 MED ORDER — ONDANSETRON 4 MG PO TBDP: 4.0000 mg | ORAL_TABLET | Freq: Three times a day (TID) | ORAL | 0 refills | Status: AC | PRN

## 2023-09-23 NOTE — ED Provider Notes (Signed)
 [facility]    CSN: [id number] Arrival date & time: 09/23/23  1953      History   Chief Complaint No chief complaint on file.   HPI Reginald Collier is a 58 y.o. male.   Reginald Collier is a 58 y.o. male presenting for chief complaint of nausea without vomiting that started 5-10 minutes after eating pulled pork BBQ and baked potato from [employer]. He feels as though the potato was "a day old" and the pulled pork was greasy. He remains nauseous but has not vomited. Denies headache, dizziness, abdominal pain, diarrhea, fever/chills, tongue swelling, throat closure sensation, and rash. Denies recent intake of foods outside of normal diet. He believes the pork to have been fully cooked.      Past Medical History:  Diagnosis Date   Allergy    Asthma    Diabetes mellitus without complication (HCC)    Dizziness    Hypertension    TIA (transient ischemic attack)     Patient Active Problem List   Diagnosis Date Noted   Tinnitus 09/22/2017   Paresthesia 02/03/2017   Muscle spasm 02/03/2017   Facial muscle weakness 02/03/2017   Asthma 06/10/2016   Left-sided Bell's palsy 01/16/2016   Benign paroxysmal positional vertigo 06/17/2015   Pituitary adenoma (HCC) 06/17/2015   BMI 38.0-38.9,adult 05/27/2014   OSA (obstructive sleep apnea) 05/27/2014   Osteoarthritis of both knees 05/27/2014   Allergic rhinitis 10/12/2012    Past Surgical History:  Procedure Laterality Date   APPENDECTOMY     HAND TENDON SURGERY     HERNIA REPAIR     KNEE SURGERY     scope on both knees       Home Medications    Prior to Admission medications   Medication Sig Start Date End Date Taking? Authorizing Provider  ondansetron (ZOFRAN-ODT) 4 MG disintegrating tablet Take 1 tablet (4 mg total) by mouth every 8 (eight) hours as needed for nausea or vomiting. 09/23/23  Yes Carlisle Beers, FNP  acetaminophen (TYLENOL) 500 MG tablet Take 2 tablets (1,000 mg total) by mouth  every 6 (six) hours as needed. 05/23/16   Cheri Fowler, PA-C  albuterol (PROVENTIL HFA;VENTOLIN HFA) 108 (90 Base) MCG/ACT inhaler Inhale 2 puffs into the lungs every 6 (six) hours as needed for wheezing or shortness of breath. 06/15/17   Mannam, Colbert Coyer, MD  albuterol (VENTOLIN HFA) 108 (90 Base) MCG/ACT inhaler Inhale 2 puffs into the lungs every 6 (six) hours as needed for wheezing or shortness of breath.    [provider]  amLODipine (NORVASC) 5 MG tablet Take 5 mg by mouth daily.    [provider]  budesonide-formoterol (SYMBICORT) 160-4.5 MCG/ACT inhaler Inhale 2 puffs into the lungs 2 (two) times daily. 04/16/17   Mannam, Colbert Coyer, MD  chlorthalidone (HYGROTON) 25 MG tablet Take 25 mg by mouth daily.    [provider]  clopidogrel (PLAVIX) 75 MG tablet Take 75 mg by mouth daily.    [provider]  fluticasone furoate-vilanterol (BREO ELLIPTA) 200-25 MCG/INH AEPB Inhale 1 puff into the lungs daily. 04/16/17   Mannam, Colbert Coyer, MD  gabapentin (NEURONTIN) 300 MG capsule Take 1 capsule (300 mg total) by mouth 3 (three) times daily. 06/11/16   Levert Feinstein, MD  Guaifenesin 1200 MG TB12 Take 1 tablet (1,200 mg total) by mouth in the morning and at bedtime. Patient not taking: Reported on 08/06/2023 10/06/22   Carlisle Beers, FNP  Ipratropium-Albuterol (COMBIVENT RESPIMAT)  20-100 MCG/ACT AERS respimat Inhale 1 puff into the lungs every 6 (six) hours.    [provider]  metFORMIN (GLUCOPHAGE-XR) 750 MG 24 hr tablet Take 750 mg by mouth in the morning and at bedtime.    [provider]  methocarbamol (ROBAXIN) 500 MG tablet Take 1 tablet (500 mg total) by mouth every 8 (eight) hours as needed for muscle spasms. 09/04/17   Petrucelli, Samantha R, PA-C  polyvinyl alcohol (LIQUIFILM TEARS) 1.4 % ophthalmic solution Place 1 drop into both eyes daily as needed for dry eyes.    [provider]  rosuvastatin (CRESTOR) 20 MG tablet Take 20 mg by  mouth daily. 05/07/22   [provider]    Family History Family History  Problem Relation Age of Onset   Diabetes Mother    Hypertension Mother    Cancer Mother    Alcohol abuse Brother    Cancer Maternal Grandmother    Cancer Maternal Grandfather    Mental illness Sister     Social History Social History   Tobacco Use   Smoking status: Never   Smokeless tobacco: Never  Vaping Use   Vaping status: Never Used  Substance Use Topics   Alcohol use: No    Alcohol/week: 0.0 standard drinks of alcohol   Drug use: No     Allergies   Patient has no known allergies.   Review of Systems Review of Systems Per HPI  Physical Exam Triage Vital Signs ED Triage Vitals  Encounter Vitals Group     BP 09/23/23 2006 134/80     Systolic BP Percentile --      Diastolic BP Percentile --      Pulse Rate 09/23/23 2006 86     Resp 09/23/23 2006 16     Temp 09/23/23 2006 98.1 F (36.7 C)     Temp Source 09/23/23 2006 Oral     SpO2 09/23/23 2006 94 %     Weight 09/23/23 2006 275 lb (124.7 kg)     Height 09/23/23 2006 6\' 2"  (1.88 m)     Head Circumference --      Peak Flow --      Pain Score 09/23/23 2005 0     Pain Loc --      Pain Education --      Exclude from Growth Chart --    No data found.  Updated Vital Signs BP 134/80 (BP Location: Right Arm)   Pulse 86   Temp 98.1 F (36.7 C) (Oral)   Resp 16   Ht 6\' 2"  (1.88 m)   Wt 275 lb (124.7 kg)   SpO2 94%   BMI 35.31 kg/m   Visual Acuity Right Eye Distance:   Left Eye Distance:   Bilateral Distance:    Right Eye Near:   Left Eye Near:    Bilateral Near:     Physical Exam Vitals and nursing note reviewed.  Constitutional:      Appearance: He is not ill-appearing or toxic-appearing.  HENT:     Head: Normocephalic and atraumatic.     Right Ear: Hearing and external ear normal.     Left Ear: Hearing and external ear normal.     Nose: Nose normal.     Mouth/Throat:     Lips: Pink.  Eyes:      General: Lids are normal. Vision grossly intact. Gaze aligned appropriately.     Extraocular Movements: Extraocular movements intact.     Conjunctiva/sclera: Conjunctivae normal.  Pulmonary:     Effort: Pulmonary effort is normal.  Abdominal:     General: Bowel sounds are normal.     Palpations: Abdomen is soft.     Tenderness: There is no abdominal tenderness. There is no right CVA tenderness, left CVA tenderness or guarding.  Musculoskeletal:     Cervical back: Neck supple.  Skin:    General: Skin is warm and dry.     Capillary Refill: Capillary refill takes less than 2 seconds.     Findings: No rash.  Neurological:     General: No focal deficit present.     Mental Status: He is alert and oriented to person, place, and time. Mental status is at baseline.     Cranial Nerves: No dysarthria or facial asymmetry.  Psychiatric:        Mood and Affect: Mood normal.        Speech: Speech normal.        Behavior: Behavior normal.        Thought Content: Thought content normal.        Judgment: Judgment normal.      UC Treatments / Results  Labs (all labs ordered are listed, but only abnormal results are displayed) Labs Reviewed - No data to display  EKG   Radiology No results found.  Procedures Procedures (including critical care time)  Medications Ordered in UC Medications  ondansetron (ZOFRAN-ODT) disintegrating tablet 4 mg (4 mg Oral Given 09/23/23 2034)    Initial Impression / Assessment and Plan / UC Course  I have reviewed the triage vital signs and the nursing notes.  Pertinent labs & imaging results that were available during my care of the patient were reviewed by me and considered in my medical decision making (see chart for details).   1. Foodborne gastroenteritis, nausea without vomiting Evaluation suggests viral gastrointestinal illness etiology.  Patient nontoxic appearing with hemodynamically stable vital signs, abdominal exam without peritoneal  signs/focal tenderness. Will manage this with antiemetic (Zofran) as needed, OTC medicines as needed for discomfort/pain, increased fluids, and rest. Zofran given in clinic. Liquid/bland diet initially, then increase diet as tolerated.   Counseled patient on potential for adverse effects with medications prescribed/recommended today, strict ER and return-to-clinic precautions discussed, patient verbalized understanding.    Final Clinical Impressions(s) / UC Diagnoses   Final diagnoses:  Nausea without vomiting  Foodborne gastroenteritis     Discharge Instructions      Your evaluation suggests that your symptoms are most likely due to viral stomach illness (gastroenteritis/"stomach bug") which will improve on its own with rest and fluids in the next few days.   Take zofran to help with nausea every 8 hours as needed. You may use over the counter medicines for aches and pains such as tylenol as needed.  Start sipping on liquids (broth, water, gatorade, etc). If you are able to keep liquids down without vomiting for 1-2 hours, you may eat bland foods like jello, pudding, applesauce, bananas, rice, and [place] toast. Once you can tolerate blands, you may return to normal diet.   Pedialyte or gatorolyte may help to prevent/fix dehydration due to vomiting and diarrhea.  Please follow up with your primary care provider for further management. Return if you experience worsening or uncontrolled pain, inability to tolerate fluids by mouth, difficulty breathing, fevers 100.23F or greater, recurrent vomiting, or any other concerning symptoms.     ED Prescriptions     Medication Sig Dispense Auth. Provider   ondansetron (ZOFRAN-ODT)  4 MG disintegrating tablet Take 1 tablet (4 mg total) by mouth every 8 (eight) hours as needed for nausea or vomiting. 20 tablet Carlisle Beers, FNP      PDMP not reviewed this encounter.   Carlisle Beers, [place] 09/23/23 2140

## 2023-09-23 NOTE — Discharge Instructions (Addendum)

## 2023-09-23 NOTE — ED Triage Notes (Signed)
 Patient here today with c/o nauseous after eating some pulled pork and a baked potato at [facility] 2 hours ago.

## 2023-09-27 ENCOUNTER — Telehealth: Payer: Self-pay | Admitting: Cardiology

## 2023-09-27 NOTE — Telephone Encounter (Signed)
 Paper Work Dropped Off:  ZIO report for review   Date: 09/27/2023  Location of paper:  Dr. Servando Salina Box   Pt stated that doctor Tobb is expecting this paperwork to review today.

## 2023-09-30 NOTE — Telephone Encounter (Signed)
 Report given to Dr. Servando Salina.

## 2023-11-30 ENCOUNTER — Ambulatory Visit: Attending: Cardiology | Admitting: Cardiology

## 2023-12-01 ENCOUNTER — Telehealth: Payer: Self-pay | Admitting: Cardiology

## 2023-12-01 NOTE — Telephone Encounter (Signed)
 Spoke with patient and he was wondering if provider changed any of medications.   Informed patient being that he did not come to his appointment no changes to his medications was changed. He verbalized understanding

## 2023-12-01 NOTE — Telephone Encounter (Signed)
 Patient stated he saw a message in MyChart mentioning their was a change in his medication. Patient would like to verify if there was any recent changes. Please advise.

## 2024-03-21 ENCOUNTER — Ambulatory Visit: Admitting: Cardiology

## 2024-04-03 ENCOUNTER — Encounter: Payer: Self-pay | Admitting: *Deleted

## 2024-04-04 ENCOUNTER — Encounter: Payer: Self-pay | Admitting: Cardiology

## 2024-04-04 ENCOUNTER — Ambulatory Visit: Attending: Cardiology | Admitting: Cardiology

## 2024-04-04 ENCOUNTER — Other Ambulatory Visit (HOSPITAL_COMMUNITY): Payer: Self-pay

## 2024-04-04 VITALS — BP 143/88 | HR 74 | Ht 74.0 in | Wt 287.0 lb

## 2024-04-04 DIAGNOSIS — E1100 Type 2 diabetes mellitus with hyperosmolarity without nonketotic hyperglycemic-hyperosmolar coma (NKHHC): Secondary | ICD-10-CM

## 2024-04-04 DIAGNOSIS — R0609 Other forms of dyspnea: Secondary | ICD-10-CM | POA: Diagnosis not present

## 2024-04-04 DIAGNOSIS — G459 Transient cerebral ischemic attack, unspecified: Secondary | ICD-10-CM

## 2024-04-04 DIAGNOSIS — I10 Essential (primary) hypertension: Secondary | ICD-10-CM | POA: Diagnosis not present

## 2024-04-04 DIAGNOSIS — G4733 Obstructive sleep apnea (adult) (pediatric): Secondary | ICD-10-CM

## 2024-04-04 DIAGNOSIS — Z01812 Encounter for preprocedural laboratory examination: Secondary | ICD-10-CM | POA: Diagnosis not present

## 2024-04-04 DIAGNOSIS — R079 Chest pain, unspecified: Secondary | ICD-10-CM

## 2024-04-04 MED ORDER — OLMESARTAN MEDOXOMIL 20 MG PO TABS
20.0000 mg | ORAL_TABLET | Freq: Every day | ORAL | 3 refills | Status: AC
  Filled 2024-04-04: qty 90, 90d supply, fill #0

## 2024-04-04 MED ORDER — OLMESARTAN MEDOXOMIL 20 MG PO TABS: 20.0000 mg | ORAL_TABLET | Freq: Every day | ORAL | 3 refills | Status: DC

## 2024-04-04 NOTE — Progress Notes (Unsigned)
 Cardiology Office Note:    Date:  04/07/2024   ID:  Reginald Collier [birth year], MRN [id number]  PCP:  Milissa Savant, MD  Cardiologist:  Dub Huntsman, DO  Electrophysiologist:  None   Referring MD: Milissa Savant, MD    I am having chest pain and shortness of breath  History of Present Illness:    Reginald Collier is a 58 y.o. male with a hx of hypertension, diabetes, and sleep apnea managed with BIPAP, and TIA.   His last visit he was experiencing chest pain. I send him for a CCTA - unfortunately this was nondiagnostic.   Today he tells me that he has been experiencing worsening chest pain and shortness of breath. He describes this as a pressure like sensation that comes and goes with associated shortness of breath. This is concerning to him.   Discussed the use of AI scribe software for clinical note transcription with the patient, who gave verbal consent to proceed    Past Medical History:  Diagnosis Date   Allergy     Asthma    Diabetes mellitus without complication (HCC)    Dizziness    Hypertension    TIA (transient ischemic attack)     Past Surgical History:  Procedure Laterality Date   APPENDECTOMY     HAND TENDON SURGERY     HERNIA REPAIR     KNEE SURGERY     scope on both knees    Current Medications: Current Meds  Medication Sig   acetaminophen  (TYLENOL ) 500 MG tablet Take 2 tablets (1,000 mg total) by mouth every 6 (six) hours as needed.   albuterol  (PROVENTIL  HFA;VENTOLIN  HFA) 108 (90 Base) MCG/ACT inhaler Inhale 2 puffs into the lungs every 6 (six) hours as needed for wheezing or shortness of breath.   albuterol  (VENTOLIN  HFA) 108 (90 Base) MCG/ACT inhaler Inhale 2 puffs into the lungs every 6 (six) hours as needed for wheezing or shortness of breath.   amLODipine  (NORVASC ) 5 MG tablet Take 5 mg by mouth daily.   budesonide -formoterol  (SYMBICORT ) 160-4.5 MCG/ACT inhaler Inhale 2 puffs into the lungs 2 (two) times daily.   chlorthalidone (HYGROTON) 25 MG  tablet Take 25 mg by mouth daily.   clopidogrel (PLAVIX) 75 MG tablet Take 75 mg by mouth daily.   fluticasone  furoate-vilanterol (BREO ELLIPTA ) 200-25 MCG/INH AEPB Inhale 1 puff into the lungs daily.   gabapentin  (NEURONTIN ) 300 MG capsule Take 1 capsule (300 mg total) by mouth 3 (three) times daily.   Ipratropium-Albuterol  (COMBIVENT RESPIMAT) 20-100 MCG/ACT AERS respimat Inhale 1 puff into the lungs every 6 (six) hours.   metFORMIN (GLUCOPHAGE-XR) 500 MG 24 hr tablet Take 500 mg by mouth 2 (two) times daily with a meal. TAKES 2 TABLETS TWICE DAILY   methocarbamol  (ROBAXIN ) 500 MG tablet Take 1 tablet (500 mg total) by mouth every 8 (eight) hours as needed for muscle spasms.   ondansetron  (ZOFRAN -ODT) 4 MG disintegrating tablet Take 1 tablet (4 mg total) by mouth every 8 (eight) hours as needed for nausea or vomiting.   polyvinyl alcohol (LIQUIFILM TEARS) 1.4 % ophthalmic solution Place 1 drop into both eyes daily as needed for dry eyes.   rosuvastatin (CRESTOR) 20 MG tablet Take 20 mg by mouth daily.   SITagliptin 100 MG TABS Take 100 mg by mouth daily.   [DISCONTINUED] olmesartan  (BENICAR ) 20 MG tablet Take 1 tablet (20 mg total) by mouth daily.     Allergies:   Patient has no known allergies.  Social History   Socioeconomic History   Marital status: Single    Spouse name: Not on file   Number of children: 0   Years of education: BS   Highest education level: Not on file  Occupational History   Occupation: Transportation/Drives truck  Tobacco Use   Smoking status: Never   Smokeless tobacco: Never  Vaping Use   Vaping status: Never Used  Substance and Sexual Activity   Alcohol use: No    Alcohol/week: 0.0 standard drinks of alcohol   Drug use: No   Sexual activity: Not on file  Other Topics Concern   Not on file  Social History Narrative   Lives at home alone.   Left-handed.   No caffeine use.   Social Drivers of Corporate investment banker Strain: Not on file  Food  Insecurity: Not on file  Transportation Needs: Not on file  Physical Activity: Not on file  Stress: Not on file  Social Connections: Not on file     Family History: The patient's family history includes Alcohol abuse in his brother; Cancer in his maternal grandfather, maternal grandmother, and mother; Diabetes in his mother; Hypertension in his mother; Mental illness in his sister.  ROS:   Review of Systems  Constitution: Negative for decreased appetite, fever and weight gain.  HENT: Negative for congestion, ear discharge, hoarse voice and sore throat.   Eyes: Negative for discharge, redness, vision loss in right eye and visual halos.  Cardiovascular: Negative for chest pain, dyspnea on exertion, leg swelling, orthopnea and palpitations.  Respiratory: Negative for cough, hemoptysis, shortness of breath and snoring.   Endocrine: Negative for heat intolerance and polyphagia.  Hematologic/Lymphatic: Negative for bleeding problem. Does not bruise/bleed easily.  Skin: Negative for flushing, nail changes, rash and suspicious lesions.  Musculoskeletal: Negative for arthritis, joint pain, muscle cramps, myalgias, neck pain and stiffness.  Gastrointestinal: Negative for abdominal pain, bowel incontinence, diarrhea and excessive appetite.  Genitourinary: Negative for decreased libido, genital sores and incomplete emptying.  Neurological: Negative for brief paralysis, focal weakness, headaches and loss of balance.  Psychiatric/Behavioral: Negative for altered mental status, depression and suicidal ideas.  Allergic/Immunologic: Negative for HIV exposure and persistent infections.    EKGs/Labs/Other Studies Reviewed:    The following studies were reviewed today:   EKG:  The ekg ordered today demonstrates Sinus rhythm, HR 74 bpm with underlying incomplete right bundle branch block.  Recent Labs: 08/06/2023: ALT 37; BUN 12; Creatinine, Ser 0.85; Magnesium 1.9; Potassium 4.1; Sodium 139  Recent  Lipid Panel No results found for: CHOL, TRIG, HDL, CHOLHDL, VLDL, LDLCALC, LDLDIRECT  Physical Exam:    VS:  BP (!) 143/88 (BP Location: Right Arm, Patient Position: Sitting, Cuff Size: Large)   Pulse 74   Ht 6' 2 (1.88 m)   Wt 287 lb (130.2 kg)   SpO2 94%   BMI 36.85 kg/m     Wt Readings from Last 3 Encounters:  04/04/24 287 lb (130.2 kg)  09/23/23 275 lb (124.7 kg)  08/06/23 287 lb (130.2 kg)     GEN: Well nourished, well developed in no acute distress HEENT: Normal NECK: No JVD; No carotid bruits LYMPHATICS: No lymphadenopathy CARDIAC: S1S2 noted,RRR, no murmurs, rubs, gallops RESPIRATORY:  Clear to auscultation without rales, wheezing or rhonchi  ABDOMEN: Soft, non-tender, non-distended, +bowel sounds, no guarding. EXTREMITIES: No edema, No cyanosis, no clubbing MUSCULOSKELETAL:  No deformity  SKIN: Warm and dry NEUROLOGIC:  Alert and oriented x 3, non-focal PSYCHIATRIC:  Normal  affect, good insight  ASSESSMENT:    1. Primary hypertension   2. DOE (dyspnea on exertion)   3. Pre-procedure lab exam   4. Chest pain of uncertain etiology   5. TIA (transient ischemic attack)   6. Type 2 diabetes mellitus with hyperosmolarity without coma, without long-term current use of insulin (HCC)   7. OSA (obstructive sleep apnea)    PLAN:    Dyspnea on exertion with chest discomfort - I am concern that this may be his angina equivalent. Hie has high risk  therefore with recent undiagnosed CCTA we need to pursue a definite testing - so will order cardiac cath (right and left heart cath ). Informed Consent   Shared Decision Making/Informed Consent The risks [stroke (1 in 1000), death (1 in 1000), kidney failure [usually temporary] (1 in 500), bleeding (1 in 200), allergic reaction [possibly serious] (1 in 200)], benefits (diagnostic support and management of coronary artery disease) and alternatives of a cardiac catheterization were discussed in detail with Mr. Laswell  and he is willing to proceed.  He is hypertensive in the office today - his antihypertensive medication needs to be optimized. Will start olmesartan  20 mg daily. Continue Amlodipine  and Chlorthalidone. He denies ever being on an Arb and denies any side effect.   Lifestyle modification discussed.   Continue use of CPAP      The patient is in agreement with the above plan. The patient left the office in stable condition.  The patient will follow up in   Medication Adjustments/Labs and Tests Ordered: Current medicines are reviewed at length with the patient today.  Concerns regarding medicines are outlined above.  Orders Placed This Encounter  Procedures   Comp Met (CMET)   Magnesium   CBC   EKG 12-Lead   Meds ordered this encounter  Medications   DISCONTD: olmesartan  (BENICAR ) 20 MG tablet    Sig: Take 1 tablet (20 mg total) by mouth daily.    Dispense:  90 tablet    Refill:  3   olmesartan  (BENICAR ) 20 MG tablet    Sig: Take 1 tablet (20 mg total) by mouth daily.    Dispense:  90 tablet    Refill:  3    Patient Instructions  Medication Instructions:  Your physician has recommended you make the following change in your medication:  START: Olmesartan  20 mg once daily   Please take your blood pressure daily for 2 weeks and send in a MyChart message. Please include heart rates. (One message at the end of the 2 weeks).   HOW TO TAKE YOUR BLOOD PRESSURE: Rest 5 minutes before taking your blood pressure. Don't smoke or drink caffeinated beverages for at least 30 minutes before. Take your blood pressure before (not after) you eat. Sit comfortably with your back supported and both feet on the floor (don't cross your legs). Elevate your arm to heart level on a table or a desk. Use the proper sized cuff. It should fit smoothly and snugly around your bare upper arm. There should be enough room to slip a fingertip under the cuff. The bottom edge of the cuff should be 1 inch above the  crease of the elbow. Ideally, take 3 measurements at one sitting and record the average.  *If you need a refill on your cardiac medications before your next appointment, please call your pharmacy*  Lab Work: CMET, Mag, CBC If you have labs (blood work) drawn today and your tests are completely normal, you will  receive your results only by: MyChart Message (if you have MyChart) OR A paper copy in the mail If you have any lab test that is abnormal or we need to change your treatment, we will call you to review the results.  Testing/Procedures:  [place] [facility] A DEPT OF [facility]ARTCARE AT [address] A DEPT OF THE . [facility] [address] [place] [address] Dept: [phone] Loc: [phone]  TWAIN STENSETH  04/04/2024  You are scheduled for a Cardiac Catheterization on Tuesday, October 28 with Dr. Alm Clay.  1. Please arrive at the [facility] (Main Entrance A) at [facility]: [address] [place], [place] [address] at 10:00 AM (This time is 2 hour(s) before your procedure to ensure your preparation).   Free valet parking service is available. You will check in at ADMITTING. The support person will be asked to wait in the waiting room.  It is OK to have someone drop you off and come back when you are ready to be discharged.    Special note: Every effort is made to have your procedure done on time. Please understand that emergencies sometimes delay scheduled procedures.  2. Diet: Light meals may be eaten up to 6 hours before scheduled procedures from 12N and after; please stop eating at 6:00 AM   Light meal consist of plain toast, fruit, light soups, crackers.   3. Hydration: You need to be well hydrated before your procedure. On October 28, you may drink approved liquids (see below) until 2 hours before the procedure, with 16 oz of water as your last intake.   List of approved liquids water, clear juice, clear tea, black  coffee, fruit juices, non-citric and without pulp, carbonated beverages, Gatorade, Kool -Aid, plain Jello-O and plain ice popsicles.  4. Labs: You will need to have blood drawn on the week of October 20th-24th. Nop appt is needed at [address] lab   5. Medication instructions in preparation for your procedure:   Contrast Allergy : No  Stop taking, Benicar  (Olmesartan ) Monday, October 27  Do not take Diabetes Med Glucophage (Metformin) on the day of the procedure and HOLD 48 HOURS AFTER THE PROCEDURE.  On the morning of your procedure, take your Aspirin 81 mg and any morning medicines NOT listed above.  You may use sips of water.  6. Plan to go home the same day, you will only stay overnight if medically necessary. 7. Bring a current list of your medications and current insurance cards. 8. You MUST have a responsible person to drive you home. 9. Someone MUST be with you the first 24 hours after you arrive home or your discharge will be delayed. 10. Please wear clothes that are easy to get on and off and wear slip-on shoes.  Thank you for allowing us  to care for you!   -- [place] [facility]   Follow-Up: At [facility], you and your health needs are our priority.  As part of our continuing mission to provide you with exceptional heart care, our providers are all part of one team.  This team includes your primary Cardiologist (physician) and Advanced Practice Providers or APPs (Physician Assistants and Nurse Practitioners) who all work together to provide you with the care you need, when you need it.  Your next appointment:   2 weeks post cath  Provider:   One of our Advanced Practice Providers (APPs): Zane Adams, PA-C  Lamarr Satterfield, NP Miriam Shams, NP  Olivia Pavy, PA-C Josefa Beauvais, NP  Leontine Salen, PA-C Orren Fabry, PA-C  [place], PA-C Ernest Dick, NP  Damien Braver, NP Jon Hails, PA-C  Waddell Donath, PA-C    Dayna  Dunn, PA-C  Scott Weaver, PA-C Lum Louis, NP Katlyn West, NP Callie Goodrich, PA-C  Xika Zhao, NP Sheng Haley, PA-C    Kathleen Johnson, PA-C   Then, Laiana Fratus, DO will plan to see you again in 9 month(s).       Blood Pressure Record Sheet To take your blood pressure, you will need a blood pressure machine. You can buy a blood pressure machine (blood pressure monitor) at your clinic, drug store, or online. When choosing one, consider: An automatic monitor that has an arm cuff. A cuff that wraps snugly around your upper arm. You should be able to fit only one finger between your arm and the cuff. A device that stores blood pressure reading results. Do not choose a monitor that measures your blood pressure from your wrist or finger. Follow your health care provider's instructions for how to take your blood pressure. To use this form: Take your blood pressure medications every day These measurements should be taken when you have been at rest for at least 10-15 min Take at least 2 readings with each blood pressure check. This makes sure the results are correct. Wait 1-2 minutes between measurements. Write down the results in the spaces on this form. Keep in mind it should always be recorded systolic over diastolic. Both numbers are important.  Repeat this every day for 2-3 weeks, or as told by your health care provider.  Make a follow-up appointment with your health care provider to discuss the results.   Blood Pressure Log Date Medications taken? (Y/N) Blood Pressure Blood Pressure                                                                                                                                                                                               Adopting a Healthy Lifestyle.  Know what a healthy weight is for you (roughly BMI <25) and aim to maintain this   Aim for 7+ servings of fruits and vegetables daily   65-80+ fluid ounces of  water or unsweet tea for healthy kidneys   Limit to max 1 drink of alcohol per day; avoid smoking/tobacco   Limit animal fats in diet for cholesterol and heart health - choose grass fed whenever available   Avoid highly processed foods, and foods high in saturated/trans fats   Aim for low stress -  take time to unwind and care for your mental health   Aim for 150 min of moderate intensity exercise weekly for heart health, and weights twice weekly for bone health   Aim for 7-9 hours of sleep daily   When it comes to diets, agreement about the perfect plan isnt easy to find, even among the experts. Experts at the [facility] of [employer] developed an idea known as the Healthy Eating Plate. Just imagine a plate divided into logical, healthy portions.   The emphasis is on diet quality:   Load up on vegetables and fruits - one-half of your plate: Aim for color and variety, and remember that potatoes dont count.   Go for whole grains - one-quarter of your plate: Whole wheat, barley, wheat berries, quinoa, oats, brown rice, and foods made with them. If you want pasta, go with whole wheat pasta.   Protein power - one-quarter of your plate: Fish, chicken, beans, and nuts are all healthy, versatile protein sources. Limit red meat.   The diet, however, does go beyond the plate, offering a few other suggestions.   Use healthy plant oils, such as olive, canola, soy, corn, sunflower and peanut. Check the labels, and avoid partially hydrogenated oil, which have unhealthy trans fats.   If youre thirsty, drink water. Coffee and tea are good in moderation, but skip sugary drinks and limit milk and dairy products to one or two daily servings.   The type of carbohydrate in the diet is more important than the amount. Some sources of carbohydrates, such as vegetables, fruits, whole grains, and beans-are healthier than others.   Finally, stay active  Signed, Dub Huntsman, DO  04/07/2024 9:48 PM     [facility]

## 2024-04-04 NOTE — Patient Instructions (Addendum)
 Medication Instructions:  Your physician has recommended you make the following change in your medication:  START: Olmesartan  20 mg once daily   Please take your blood pressure daily for 2 weeks and send in a MyChart message. Please include heart rates. (One message at the end of the 2 weeks).   HOW TO TAKE YOUR BLOOD PRESSURE: Rest 5 minutes before taking your blood pressure. Don't smoke or drink caffeinated beverages for at least 30 minutes before. Take your blood pressure before (not after) you eat. Sit comfortably with your back supported and both feet on the floor (don't cross your legs). Elevate your arm to heart level on a table or a desk. Use the proper sized cuff. It should fit smoothly and snugly around your bare upper arm. There should be enough room to slip a fingertip under the cuff. The bottom edge of the cuff should be 1 inch above the crease of the elbow. Ideally, take 3 measurements at one sitting and record the average.  *If you need a refill on your cardiac medications before your next appointment, please call your pharmacy*  Lab Work: CMET, Mag, CBC If you have labs (blood work) drawn today and your tests are completely normal, you will receive your results only by: MyChart Message (if you have MyChart) OR A paper copy in the mail If you have any lab test that is abnormal or we need to change your treatment, we will call you to review the results.  Testing/Procedures:  [place] [facility] A DEPT OF [address]. [facility]ARTC[facility] AT [address] A DEPT OF THE [place]. [facility] [address] [place] [address] Dept: [phone] Loc: [phone]  ZEKI BEDROSIAN  04/04/2024  You are scheduled for a Cardiac Catheterization on Tuesday, October 28 with Dr. Alm Clay.  1. Please arrive at the [facility] (Main Entrance A) at [facility]: [address] [place], [place] [address] at 10:00 AM (This time is 2 hour(s) before your procedure to  ensure your preparation).   Free valet parking service is available. You will check in at ADMITTING. The support person will be asked to wait in the waiting room.  It is OK to have someone drop you off and come back when you are ready to be discharged.    Special note: Every effort is made to have your procedure done on time. Please understand that emergencies sometimes delay scheduled procedures.  2. Diet: Light meals may be eaten up to 6 hours before scheduled procedures from 12N and after; please stop eating at 6:00 AM   Light meal consist of plain toast, fruit, light soups, crackers.   3. Hydration: You need to be well hydrated before your procedure. On October 28, you may drink approved liquids (see below) until 2 hours before the procedure, with 16 oz of water as your last intake.   List of approved liquids water, clear juice, clear tea, black coffee, fruit juices, non-citric and without pulp, carbonated beverages, Gatorade, Kool -Aid, plain Jello-O and plain ice popsicles.  4. Labs: You will need to have blood drawn on the week of October 20th-24th. Nop appt is needed at [address] lab   5. Medication instructions in preparation for your procedure:   Contrast Allergy : No  Stop taking, Benicar  (Olmesartan ) Monday, October 27  Do not take Diabetes Med Glucophage (Metformin) on the day of the procedure and HOLD 48 HOURS AFTER THE PROCEDURE.  On the morning  of your procedure, take your Aspirin 81 mg and any morning medicines NOT listed above.  You may use sips of water.  6. Plan to go home the same day, you will only stay overnight if medically necessary. 7. Bring a current list of your medications and current insurance cards. 8. You MUST have a responsible person to drive you home. 9. Someone MUST be with you the first 24 hours after you arrive home or your discharge will be delayed. 10. Please wear clothes that are easy to get on and off and wear slip-on shoes.  Thank  you for allowing us  to care for you!   -- [place] [facility]   Follow-Up: At [facility], you and your health needs are our priority.  As part of our continuing mission to provide you with exceptional heart care, our providers are all part of one team.  This team includes your primary Cardiologist (physician) and Advanced Practice Providers or APPs (Physician Assistants and Nurse Practitioners) who all work together to provide you with the care you need, when you need it.  Your next appointment:   2 weeks post cath  Provider:   One of our Advanced Practice Providers (APPs): Morse Clause, PA-C  Lamarr Satterfield, NP Miriam Shams, NP  Olivia Pavy, PA-C Josefa Beauvais, NP  Leontine Salen, PA-C Orren Fabry, PA-C  [place], PA-C Ernest Dick, NP  Damien Braver, NP Jon Hails, PA-C  Waddell Donath, PA-C    Dayna Dunn, PA-C  Scott Weaver, PA-C Lum Louis, NP Katlyn West, NP Callie Goodrich, PA-C  Xika Zhao, NP Sheng Haley, PA-C    Kathleen Johnson, PA-C   Then, Kardie Tobb, DO will plan to see you again in 9 month(s).       Blood Pressure Record Sheet To take your blood pressure, you will need a blood pressure machine. You can buy a blood pressure machine (blood pressure monitor) at your clinic, drug store, or online. When choosing one, consider: An automatic monitor that has an arm cuff. A cuff that wraps snugly around your upper arm. You should be able to fit only one finger between your arm and the cuff. A device that stores blood pressure reading results. Do not choose a monitor that measures your blood pressure from your wrist or finger. Follow your health care provider's instructions for how to take your blood pressure. To use this form: Take your blood pressure medications every day These measurements should be taken when you have been at rest for at least 10-15 min Take at least 2 readings with each blood pressure check. This makes sure  the results are correct. Wait 1-2 minutes between measurements. Write down the results in the spaces on this form. Keep in mind it should always be recorded systolic over diastolic. Both numbers are important.  Repeat this every day for 2-3 weeks, or as told by your health care provider.  Make a follow-up appointment with your health care provider to discuss the results.   Blood Pressure Log Date Medications taken? (Y/N) Blood Pressure Blood Pressure

## 2024-04-11 ENCOUNTER — Ambulatory Visit: Payer: Self-pay | Admitting: Cardiology

## 2024-05-08 ENCOUNTER — Telehealth: Payer: Self-pay | Admitting: *Deleted

## 2024-05-08 NOTE — Telephone Encounter (Signed)
 I spoke with patient and he requests to cancel Right/Left Heart Cath scheduled 05/09/24. Patient tells me has history of TIA x 2, no recent TIA symptoms, but wants to follow up with his neurologist before proceeding with cardiac cath. Patient also has questions about insurance coverage for cath and his financial responsibility. I have given him the phone number for [facility] [phone] to help him with those questions.  Patient has an already scheduled appointment 05/25/24 with Orren Fabry, PA. Patient plans to follow up with neurology and will discuss rescheduling cardiac cath at 05/25/24 appointment with Tessa-needs BMP/CBC prior to cath. If patient decides he wants to schedule cardiac cath prior to 05/25/24 office visit, he will need sooner office visit and pre-procedure BMP/CBC.

## 2024-05-08 NOTE — Telephone Encounter (Signed)
 I have been unable to get in touch with patient to confirm that he plans to proceed with Right/Left Heart Cath 05/09/24. Patient has not had pre-procedure BMP/CBC and last Office Visit was 04/04/24 with Dr Kardie Tobb.   Cardiac Catheterization scheduled at [facility] for: Tuesday May 09, 2024 12 Noon Arrival time [facility] Main Entrance A at: 9:30 AM-needs BMP/CBC  Diet: -May have light meal until 6 AM. (6 hours before procedure time) Approved light meal consists of plain toast, fruit, light soups, crackers.  Hydration: -May drink clear liquids until 2 hours before the procedure.  Approved liquids: Water, clear tea, black coffee, fruit juices-non-citric and without pulp,Gatorade, plain Jello/popsicles.   -Please drink 16 oz of water 2 hours before procedure.  Medication instructions: -Hold:  Chlorthalidone-AM of procedure  Sitagliptin/Januvia-AM of procedure -Other usual morning medications can be taken including aspirin 81 mg.  Plan to go home the same day, you will only stay overnight if medically necessary.  You must have responsible adult to drive you home.  Someone must be with you the first 24 hours after you arrive home.

## 2024-05-09 ENCOUNTER — Ambulatory Visit (HOSPITAL_COMMUNITY): Admission: RE | Admit: 2024-05-09 | Source: Home / Self Care | Admitting: Cardiology

## 2024-05-09 ENCOUNTER — Encounter (HOSPITAL_COMMUNITY): Admission: RE | Payer: Self-pay | Source: Home / Self Care

## 2024-05-09 SURGERY — RIGHT/LEFT HEART CATH AND CORONARY ANGIOGRAPHY
Anesthesia: LOCAL

## 2024-05-24 NOTE — Progress Notes (Signed)
 Cardiology Office Note   Date:  05/25/2024  ID:  Demerius, Podolak March 11, 1966, MRN [id number] PCP: Milissa Savant, MD   HeartCare Providers Cardiologist:  Dub Huntsman, DO   History of Present Illness Reginald Collier is a 58 y.o. male with a past medical history of hypertension, diabetes, and sleep apnea managed with BiPAP and TIA here for follow-up appointment.  He was seen 04/07/2024 and at that time he was having worsening chest pain and shortness of breath.  Previously had a CCTA done and unfortunately was nondiagnostic.  At that last office visit he described the pain as pressure that comes and goes with associated shortness of breath.  This was concerning to him.  A right and left cardiac catheterization was ordered at this visit.  For some reason or another this was not done so he was referred back to our office to reschedule his cardiac catheterization.  Today, he presents with a hx of transient ischemic attacks and hypertension, presents with chest pain. He experiences chest pain described as a tight, pressure-like sensation with a buzzing feeling, occurring both day and night, sometimes with a rapid, forceful heartbeat. Cardiovascular evaluations include a heart monitor, echocardiogram, and CT cardiovascular scan at [facility].  He has a history of transient ischemic attacks, initially diagnosed as strokes, with episodes approximately six months apart, associated with his work environment. He has been treated at [facility], [facility], and [facility], and is under neurology care at the [facility] specialty clinic.  He experiences shortness of breath, feeling exhausted and slow during activities. He has consulted a sleep specialist, who suggested congestion might affect respiratory function and heart rhythm.  He reports shooting pains from the forearm to the shoulder bilaterally. Current medications include clonidine, amlodipine , metformin, and Plavix,  with clonidine and amlodipine  taken in the morning, metformin with meals, and Plavix early in the morning.  He is scheduled to see Dr. Corinthia Hales (neuro) right before thanksgiving and obtain clearance for the cardiac cath. For this reason, we have scheduled LHC the first week of December. This will also allow insurance to approve the procedure.  He does have some palpitations which are short-lived and infrequent.  Describes these as faster heartbeats or more forceful heartbeats.  Offered a monitor but he said he has had 1 of these in the past with his PCP. (He is followed by outside providers).   No edema, orthopnea, PND.   Discussed the use of AI scribe software for clinical note transcription with the patient, who gave verbal consent to proceed.   ROS: Pertinent ROS in HPI  Studies Reviewed  ECHO 06/18/23  IMPRESSIONS     1. Left ventricular ejection fraction, by estimation, is 55 to 60%. Left  ventricular ejection fraction by 3D volume is 58 %. The left ventricle has  normal function. The left ventricle has no regional wall motion  abnormalities. There is moderate  asymmetric left ventricular hypertrophy of the basal-septal segment. Left  ventricular diastolic parameters were normal.   2. Right ventricular systolic function is normal. The right ventricular  size is normal. Tricuspid regurgitation signal is inadequate for assessing  PA pressure.   3. The mitral valve is normal in structure. Trivial mitral valve  regurgitation.   4. The aortic valve is tricuspid. Aortic valve regurgitation is not  visualized. No aortic stenosis is present.   5. Aortic dilatation noted. There is dilatation of the ascending aorta,  measuring 40 mm.   6.  The inferior vena cava is normal in size with greater than 50%  respiratory variability, suggesting right atrial pressure of 3 mmHg.   FINDINGS   Left Ventricle: Left ventricular ejection fraction, by estimation, is 55  to 60%. Left ventricular  ejection fraction by 3D volume is 58 %. The left  ventricle has normal function. The left ventricle has no regional wall  motion abnormalities. The left  ventricular internal cavity size was normal in size. There is moderate  asymmetric left ventricular hypertrophy of the basal-septal segment. Left  ventricular diastolic parameters were normal.   Right Ventricle: The right ventricular size is normal. No increase in  right ventricular wall thickness. Right ventricular systolic function is  normal. Tricuspid regurgitation signal is inadequate for assessing PA  pressure.   Left Atrium: Left atrial size was normal in size.   Right Atrium: Right atrial size was normal in size.   Pericardium: There is no evidence of pericardial effusion.   Mitral Valve: The mitral valve is normal in structure. Trivial mitral  valve regurgitation.   Tricuspid Valve: The tricuspid valve is normal in structure. Tricuspid  valve regurgitation is trivial.   Aortic Valve: The aortic valve is tricuspid. Aortic valve regurgitation is  not visualized. No aortic stenosis is present.   Pulmonic Valve: The pulmonic valve was not well visualized. Pulmonic valve  regurgitation is not visualized.   Aorta: The aortic root is normal in size and structure and aortic  dilatation noted. There is dilatation of the ascending aorta, measuring 40  mm.   Venous: The inferior vena cava is normal in size with greater than 50%  respiratory variability, suggesting right atrial pressure of 3 mmHg.   IAS/Shunts: The interatrial septum was not well visualized.       CT coronary 09/09/2023  IMPRESSION: 1. Coronary calcium score of 0.   2.  Minimal aortic atherosclerosis.   3. This study was suppose to be a coronary CTA (CCTA); however, due to sub optimal images only the CAC score is being reported. Patient was offered to be re-scanned, decision pending. Ordering physician aware of study quality and also agrees that only  calcium score should be reported. Recommend either repeat CCTA or different imaging modality to evaluate for ischemia.   4. Non-cardiac: See separate report from [facility].       Physical Exam VS:  BP 118/70   Pulse 86   Ht 6' 2 (1.88 m)   Wt 286 lb (129.7 kg)   SpO2 97%   BMI 36.72 kg/m        Wt Readings from Last 3 Encounters:  05/25/24 286 lb (129.7 kg)  04/04/24 287 lb (130.2 kg)  09/23/23 275 lb (124.7 kg)    GEN: Well nourished, well developed in no acute distress NECK: No JVD; No carotid bruits CARDIAC: RRR, no murmurs, rubs, gallops RESPIRATORY:  Clear to auscultation without rales, wheezing or rhonchi  ABDOMEN: Soft, non-tender, non-distended EXTREMITIES:  No edema; No deformity   ASSESSMENT AND PLAN  1.  Primary hypertension - Continue current medication regimen, blood pressure well-controlled today -Continue low-sodium heart healthy diet  2.  DOE/chest pain -Need to arrange cardiac catheterization after neurology clearance the first week of December - Nitroglycerin  sublingual tabs prescribed  3.  TIA - Neurology follow-up the end of the month -Continue Plavix  5.  Type 2 diabetes mellitus -Recommend routine follow-up with primary care for A1c surveillance  6.  OSA -This is managed with his BiPAP device  and he is compliant      Dispo: He can follow-up 2 weeks postcardiac catheterization  Signed, Orren LOISE Fabry, PA-C

## 2024-05-25 ENCOUNTER — Ambulatory Visit: Admitting: Physician Assistant

## 2024-05-25 ENCOUNTER — Ambulatory Visit: Attending: Physician Assistant | Admitting: Physician Assistant

## 2024-05-25 VITALS — BP 118/70 | HR 86 | Ht 74.0 in | Wt 286.0 lb

## 2024-05-25 DIAGNOSIS — R0609 Other forms of dyspnea: Secondary | ICD-10-CM

## 2024-05-25 DIAGNOSIS — G4733 Obstructive sleep apnea (adult) (pediatric): Secondary | ICD-10-CM

## 2024-05-25 DIAGNOSIS — I10 Essential (primary) hypertension: Secondary | ICD-10-CM | POA: Diagnosis not present

## 2024-05-25 DIAGNOSIS — G459 Transient cerebral ischemic attack, unspecified: Secondary | ICD-10-CM | POA: Diagnosis not present

## 2024-05-25 DIAGNOSIS — E1100 Type 2 diabetes mellitus with hyperosmolarity without nonketotic hyperglycemic-hyperosmolar coma (NKHHC): Secondary | ICD-10-CM

## 2024-05-25 DIAGNOSIS — R079 Chest pain, unspecified: Secondary | ICD-10-CM

## 2024-05-25 DIAGNOSIS — R072 Precordial pain: Secondary | ICD-10-CM

## 2024-05-25 DIAGNOSIS — E782 Mixed hyperlipidemia: Secondary | ICD-10-CM

## 2024-05-25 MED ORDER — NITROGLYCERIN 0.4 MG SL SUBL: 0.4000 mg | SUBLINGUAL_TABLET | SUBLINGUAL | 3 refills | Status: AC | PRN

## 2024-05-25 NOTE — Patient Instructions (Addendum)
 Medication Instructions:  START Nitroglycerin  0.4mg  Take 1 as needed for emergency chest pain. Take first dose for emergency chest pain; WAIT 5 minutes and then take 2nd dose. IF still having pain if still having chest pain CALL 911 wait an additional 5 minutes before taking final dose. Do not take more than 3 doses in a day. DO NOT TAKE 72 HOURS PRIOR TO OR 72 HOURS AFTER TAKING ERECTILE DYSFUNCTION MEDIATIONS.  *If you need a refill on your cardiac medications before your next appointment, please call your pharmacy*  Lab Work: TODAY-BMET & CBC If you have labs (blood work) drawn today and your tests are completely normal, you will receive your results only by: MyChart Message (if you have MyChart) OR A paper copy in the mail If you have any lab test that is abnormal or we need to change your treatment, we will call you to review the results.  Testing/Procedures: Your physician has requested that you have a cardiac catheterization. Cardiac catheterization is used to diagnose and/or treat various heart conditions. Doctors may recommend this procedure for a number of different reasons. The most common reason is to evaluate chest pain. Chest pain can be a symptom of coronary artery disease (CAD), and cardiac catheterization can show whether plaque is narrowing or blocking your heart's arteries. This procedure is also used to evaluate the valves, as well as measure the blood flow and oxygen levels in different parts of your heart. For further information please visit [email or web address] Please follow instruction sheet, as given.  Follow-Up: At [facility], you and your health needs are our priority.  As part of our continuing mission to provide you with exceptional heart care, our providers are all part of one team.  This team includes your primary Cardiologist (physician) and Advanced Practice Providers or APPs (Physician Assistants and Nurse Practitioners) who all work together to provide  you with the care you need, when you need it.  Your next appointment:   2 week(s) AFTER CATH (06/16/24 IS WHEN CATH IS SCHEDULED)  Provider:   Kardie Tobb, DO or ANY APP   We recommend signing up for the patient portal called MyChart.  Sign up information is provided on this After Visit Summary.  MyChart is used to connect with patients for Virtual Visits (Telemedicine).  Patients are able to view lab/test results, encounter notes, upcoming appointments, etc.  Non-urgent messages can be sent to your provider as well.   To learn more about what you can do with MyChart, go to [email or web address].   Other Instructions       Cardiac Catheterization   You are scheduled for a Cardiac Catheterization on Friday, December 5 with Dr. Lonni Cash.  1. Please arrive at the [facility] (Main Entrance A) at [facility]: [address] [place], [place] [address] at 10:00 AM (This time is 2 hour(s) before your procedure to ensure your preparation).   Free valet parking service is available. You will check in at ADMITTING. The support person will be asked to wait in the waiting room.  It is OK to have someone drop you off and come back when you are ready to be discharged.        Special note: Every effort is made to have your procedure done on time. Please understand that emergencies sometimes delay scheduled procedures.  2. Diet: Light meals may be eaten up to 6 hours before scheduled procedures from 12N and after; please stop eating at 6:00 AM  Light meal consist of plain toast, fruit, light soups, crackers.  3. Hydration:On December 5, you may drink approved liquids (see below) until 2 hours before the procedure with 8 oz of water as your last intake.   List of approved liquids water, clear juice, clear tea, black coffee, fruit juices, non-citric and without pulp, carbonated beverages, Gatorade, Kool -Aid, plain Jello-O and plain ice popsicles.  4. Labs: You will need to  have blood drawn on Thursday, November 13 at [facility] (1st Floor), [address], [place], [place] [address]. You do not need to be fasting.  5. Medication instructions in preparation for your procedure:   Contrast Allergy : No  HOLD Chlorothiazide on the morning of your procedure HOLD Januvia on the morning of your procedure  On the morning of your procedure, take Aspirin 81 mg and any morning medicines NOT listed above.  You may use sips of water.  6. Plan to go home the same day, you will only stay overnight if medically necessary.  7. You MUST have a responsible adult to drive you home.  8. An adult MUST be with you the first 24 hours after you arrive home.  9. Bring a current list of your medications, and the last time and date medication taken.  10. Bring ID and current insurance cards.  11.Please wear clothes that are easy to get on and off and wear slip-on shoes.  Thank you for allowing us  to care for you!   -- [facility]

## 2024-05-26 ENCOUNTER — Ambulatory Visit: Payer: Self-pay | Admitting: Physician Assistant

## 2024-05-26 LAB — BASIC METABOLIC PANEL WITH GFR
BUN/Creatinine Ratio: 15 (ref 9–20)
BUN: 12 mg/dL (ref 6–24)
CO2: 24 mmol/L (ref 20–29)
Calcium: 9.5 mg/dL (ref 8.7–10.2)
Chloride: 101 mmol/L (ref 96–106)
Creatinine, Ser: 0.81 mg/dL (ref 0.76–1.27)
Glucose: 137 mg/dL — AB (ref 70–99)
Potassium: 4 mmol/L (ref 3.5–5.2)
Sodium: 140 mmol/L (ref 134–144)
eGFR: 103 mL/min/1.73 (ref >59)

## 2024-05-26 LAB — CBC
Hematocrit: 38 % (ref 37.5–51.0)
Hemoglobin: 12.7 g/dL — ABNORMAL LOW (ref 13.0–17.7)
MCH: 30.3 pg (ref 26.6–33.0)
MCHC: 33.4 g/dL (ref 31.5–35.7)
MCV: 91 fL (ref 79–97)
Platelets: 292 x10E3/uL (ref 150–450)
RBC: 4.19 x10E6/uL (ref 4.14–5.80)
RDW: 13.1 % (ref 11.6–15.4)
WBC: 5.6 x10E3/uL (ref 3.4–10.8)

## 2024-05-29 NOTE — Telephone Encounter (Signed)
 S/W patient and informed patient of the lab results and patient voiced understanding.

## 2024-06-06 ENCOUNTER — Telehealth: Payer: Self-pay | Admitting: Cardiology

## 2024-06-06 NOTE — Telephone Encounter (Signed)
 Dr. Corinthia Public with [facility] calling to state he believes from a neurological standpoint, pt is okay to have cath procedure. He says pt had a carotid ultrasound in 04/05/24 that was normal with no stenosis. He can be reached [phone] at with any questions. Please advise.

## 2024-06-14 ENCOUNTER — Telehealth: Payer: Self-pay | Admitting: Cardiology

## 2024-06-14 NOTE — Telephone Encounter (Signed)
 Pt would like a c/b to cancel 12/5 cath appt. Please advise.   He ask his [facility] provider be contacted at [phone] [facility].

## 2024-06-15 ENCOUNTER — Telehealth: Payer: Self-pay | Admitting: *Deleted

## 2024-06-15 NOTE — Telephone Encounter (Signed)
 I placed call to patient to review Cardiac Cath instructions for 06/16/24. Patient tells me he received call from [facility] and was told co-pay for cath was going to be at least $435 and maybe more than that, unable to give him final amount he may owe. Patient is requesting to cancel Cardiac Cath scheduled 06/16/24 until he can find out more about his financial responsibility for cath. I have given him the phone number for the [facility] [phone]-hopefully they can assist him with billing/payment information and have encouraged patient to contact his insurance company.  At patient's request, I have cancelled Cardiac Cath scheduled 06/16/24. Patient plans to keep already scheduled appt in our office 06/29/24, pt knows to call our office if he decides to reschedule cath before then. Pt advised if he decides to reschedule cath, he will need to reschedule before 06/23/24 to be within 30 days of previous office visit 05/25/24.

## 2024-06-16 ENCOUNTER — Encounter (HOSPITAL_COMMUNITY): Admission: RE | Payer: Self-pay | Source: Home / Self Care

## 2024-06-16 ENCOUNTER — Ambulatory Visit (HOSPITAL_COMMUNITY): Admission: RE | Admit: 2024-06-16 | Source: Home / Self Care | Admitting: Cardiology

## 2024-06-16 SURGERY — LEFT HEART CATH AND CORONARY ANGIOGRAPHY
Anesthesia: LOCAL

## 2024-06-29 ENCOUNTER — Ambulatory Visit: Admitting: Cardiology

## 2024-08-23 ENCOUNTER — Ambulatory Visit: Admitting: Cardiology
